# Patient Record
Sex: Male | Born: 1960 | Race: Black or African American | Hispanic: No | Marital: Single | Smoking: Never smoker
Health system: Southern US, Community
[De-identification: ages and names within clinical notes are randomized; demographics above are authoritative.]

## PROBLEM LIST (undated history)

## (undated) DIAGNOSIS — Z8042 Family history of malignant neoplasm of prostate: Secondary | ICD-10-CM

## (undated) DIAGNOSIS — Z800 Family history of malignant neoplasm of digestive organs: Secondary | ICD-10-CM

## (undated) DIAGNOSIS — K219 Gastro-esophageal reflux disease without esophagitis: Secondary | ICD-10-CM

## (undated) DIAGNOSIS — I10 Essential (primary) hypertension: Secondary | ICD-10-CM

## (undated) DIAGNOSIS — E119 Type 2 diabetes mellitus without complications: Secondary | ICD-10-CM

## (undated) DIAGNOSIS — C61 Malignant neoplasm of prostate: Secondary | ICD-10-CM

## (undated) DIAGNOSIS — Z803 Family history of malignant neoplasm of breast: Secondary | ICD-10-CM

## (undated) DIAGNOSIS — N401 Enlarged prostate with lower urinary tract symptoms: Secondary | ICD-10-CM

## (undated) HISTORY — DX: Type 2 diabetes mellitus without complications: E11.9

## (undated) HISTORY — DX: Family history of malignant neoplasm of digestive organs: Z80.0

## (undated) HISTORY — DX: Family history of malignant neoplasm of breast: Z80.3

## (undated) HISTORY — PX: DENTAL SURGERY: SHX609

## (undated) HISTORY — PX: PROSTATE BIOPSY: SHX241

## (undated) HISTORY — DX: Family history of malignant neoplasm of prostate: Z80.42

---

## 1997-07-01 ENCOUNTER — Other Ambulatory Visit: Admission: RE | Admit: 1997-07-01 | Discharge: 1997-07-01 | Payer: Self-pay | Admitting: Family Medicine

## 2007-12-18 ENCOUNTER — Emergency Department (HOSPITAL_COMMUNITY): Admission: EM | Admit: 2007-12-18 | Discharge: 2007-12-18 | Payer: Self-pay | Admitting: Emergency Medicine

## 2016-01-16 HISTORY — PX: COLONOSCOPY WITH PROPOFOL: SHX5780

## 2016-02-24 DIAGNOSIS — M7552 Bursitis of left shoulder: Secondary | ICD-10-CM | POA: Diagnosis not present

## 2016-02-24 DIAGNOSIS — Z5181 Encounter for therapeutic drug level monitoring: Secondary | ICD-10-CM | POA: Diagnosis not present

## 2016-02-24 DIAGNOSIS — I10 Essential (primary) hypertension: Secondary | ICD-10-CM | POA: Diagnosis not present

## 2016-02-24 DIAGNOSIS — Z131 Encounter for screening for diabetes mellitus: Secondary | ICD-10-CM | POA: Diagnosis not present

## 2016-02-29 DIAGNOSIS — M25512 Pain in left shoulder: Secondary | ICD-10-CM | POA: Diagnosis not present

## 2016-03-06 DIAGNOSIS — Z0000 Encounter for general adult medical examination without abnormal findings: Secondary | ICD-10-CM | POA: Diagnosis not present

## 2016-03-06 DIAGNOSIS — Z131 Encounter for screening for diabetes mellitus: Secondary | ICD-10-CM | POA: Diagnosis not present

## 2016-03-06 DIAGNOSIS — Z01118 Encounter for examination of ears and hearing with other abnormal findings: Secondary | ICD-10-CM | POA: Diagnosis not present

## 2016-03-06 DIAGNOSIS — Z136 Encounter for screening for cardiovascular disorders: Secondary | ICD-10-CM | POA: Diagnosis not present

## 2016-03-21 DIAGNOSIS — Z1211 Encounter for screening for malignant neoplasm of colon: Secondary | ICD-10-CM | POA: Diagnosis not present

## 2016-03-28 DIAGNOSIS — M25512 Pain in left shoulder: Secondary | ICD-10-CM | POA: Diagnosis not present

## 2016-03-29 DIAGNOSIS — M7552 Bursitis of left shoulder: Secondary | ICD-10-CM | POA: Diagnosis not present

## 2016-03-29 DIAGNOSIS — E119 Type 2 diabetes mellitus without complications: Secondary | ICD-10-CM | POA: Diagnosis not present

## 2016-03-29 DIAGNOSIS — R7989 Other specified abnormal findings of blood chemistry: Secondary | ICD-10-CM | POA: Diagnosis not present

## 2016-03-29 DIAGNOSIS — I10 Essential (primary) hypertension: Secondary | ICD-10-CM | POA: Diagnosis not present

## 2016-04-10 DIAGNOSIS — D128 Benign neoplasm of rectum: Secondary | ICD-10-CM | POA: Diagnosis not present

## 2016-04-10 DIAGNOSIS — D123 Benign neoplasm of transverse colon: Secondary | ICD-10-CM | POA: Diagnosis not present

## 2016-04-10 DIAGNOSIS — Z1211 Encounter for screening for malignant neoplasm of colon: Secondary | ICD-10-CM | POA: Diagnosis not present

## 2016-04-10 DIAGNOSIS — K621 Rectal polyp: Secondary | ICD-10-CM | POA: Diagnosis not present

## 2016-04-10 DIAGNOSIS — K635 Polyp of colon: Secondary | ICD-10-CM | POA: Diagnosis not present

## 2016-06-26 DIAGNOSIS — E119 Type 2 diabetes mellitus without complications: Secondary | ICD-10-CM | POA: Diagnosis not present

## 2016-06-26 DIAGNOSIS — R7989 Other specified abnormal findings of blood chemistry: Secondary | ICD-10-CM | POA: Diagnosis not present

## 2016-06-26 DIAGNOSIS — I10 Essential (primary) hypertension: Secondary | ICD-10-CM | POA: Diagnosis not present

## 2016-10-30 DIAGNOSIS — E119 Type 2 diabetes mellitus without complications: Secondary | ICD-10-CM | POA: Diagnosis not present

## 2016-10-30 DIAGNOSIS — E559 Vitamin D deficiency, unspecified: Secondary | ICD-10-CM | POA: Diagnosis not present

## 2016-10-30 DIAGNOSIS — I10 Essential (primary) hypertension: Secondary | ICD-10-CM | POA: Diagnosis not present

## 2016-12-11 DIAGNOSIS — R972 Elevated prostate specific antigen [PSA]: Secondary | ICD-10-CM | POA: Diagnosis not present

## 2017-01-17 DIAGNOSIS — R972 Elevated prostate specific antigen [PSA]: Secondary | ICD-10-CM | POA: Diagnosis not present

## 2017-01-17 DIAGNOSIS — N4232 Atypical small acinar proliferation of prostate: Secondary | ICD-10-CM | POA: Diagnosis not present

## 2017-01-17 DIAGNOSIS — C61 Malignant neoplasm of prostate: Secondary | ICD-10-CM | POA: Diagnosis not present

## 2017-01-22 DIAGNOSIS — I10 Essential (primary) hypertension: Secondary | ICD-10-CM | POA: Diagnosis not present

## 2017-01-22 DIAGNOSIS — E119 Type 2 diabetes mellitus without complications: Secondary | ICD-10-CM | POA: Diagnosis not present

## 2017-01-22 DIAGNOSIS — E559 Vitamin D deficiency, unspecified: Secondary | ICD-10-CM | POA: Diagnosis not present

## 2017-01-30 DIAGNOSIS — C61 Malignant neoplasm of prostate: Secondary | ICD-10-CM | POA: Diagnosis not present

## 2017-02-12 DIAGNOSIS — C61 Malignant neoplasm of prostate: Secondary | ICD-10-CM | POA: Diagnosis not present

## 2017-02-13 ENCOUNTER — Encounter: Payer: Self-pay | Admitting: Radiation Oncology

## 2017-02-22 ENCOUNTER — Encounter: Payer: Self-pay | Admitting: Radiation Oncology

## 2017-02-22 NOTE — Progress Notes (Signed)
GU Location of Tumor / Histology: prostatic adenocarcinoma  If Prostate Cancer, Gleason Score is (4 + 3) and PSA is (5.13). Prostate volume: 58 cc.  10/31/2016  PSA  4.9  Cl[place] Cliff Was referred by Dr. Doristine Section Bonsu to Dr. Karsten Ro November 2018 for further evaluation of an elevated PSA.  Biopsies of prostate (if applicable) revealed:    Past/Anticipated interventions by urology, if any: biopsy, referral to radiation oncology to discuss external beam radiation vs seed implant. Patient not interested in surgery.  Past/Anticipated interventions by medical oncology, if any: no  Weight changes, if any: no  Bowel/Bladder complaints, if any: IPSS 4. Denies dysuria, hematuria, urinary leakage or incontinence.   Nausea/Vomiting, if any: no  Pain issues, if any:  Occasional left shoulder and knee pain  SAFETY ISSUES:  Prior radiation? no  Pacemaker/ICD? no  Possible current pregnancy? no  Is the patient on methotrexate? no  Current Complaints / other details:  57 year old male. Single. No children. Strong family history of cancer: father deceased (gastric), mother alive (breast), paternal grandmother deceased (breast).

## 2017-02-25 ENCOUNTER — Ambulatory Visit
Admission: RE | Admit: 2017-02-25 | Discharge: 2017-02-25 | Disposition: A | Discharge status: Home / Self Care | Payer: Commercial Managed Care - PPO | Source: Ambulatory Visit | Attending: Radiation Oncology | Admitting: Radiation Oncology

## 2017-02-25 ENCOUNTER — Encounter: Payer: Self-pay | Admitting: Radiation Oncology

## 2017-02-25 ENCOUNTER — Other Ambulatory Visit: Payer: Self-pay

## 2017-02-25 ENCOUNTER — Encounter: Payer: Self-pay | Admitting: Medical Oncology

## 2017-02-25 VITALS — BP 141/90 | HR 66 | Temp 98.0°F | Resp 18 | Ht 75.0 in | Wt 218.6 lb

## 2017-02-25 DIAGNOSIS — R972 Elevated prostate specific antigen [PSA]: Secondary | ICD-10-CM | POA: Diagnosis not present

## 2017-02-25 DIAGNOSIS — Z79899 Other long term (current) drug therapy: Secondary | ICD-10-CM | POA: Insufficient documentation

## 2017-02-25 DIAGNOSIS — Z803 Family history of malignant neoplasm of breast: Secondary | ICD-10-CM | POA: Insufficient documentation

## 2017-02-25 DIAGNOSIS — Z809 Family history of malignant neoplasm, unspecified: Secondary | ICD-10-CM

## 2017-02-25 DIAGNOSIS — C61 Malignant neoplasm of prostate: Secondary | ICD-10-CM | POA: Insufficient documentation

## 2017-02-25 DIAGNOSIS — Z800 Family history of malignant neoplasm of digestive organs: Secondary | ICD-10-CM | POA: Diagnosis not present

## 2017-02-25 DIAGNOSIS — Z8042 Family history of malignant neoplasm of prostate: Secondary | ICD-10-CM | POA: Diagnosis not present

## 2017-02-25 HISTORY — DX: Malignant neoplasm of prostate: C61

## 2017-02-25 NOTE — Progress Notes (Signed)
See progress note under physician encounter. 

## 2017-02-25 NOTE — Progress Notes (Signed)
Introduced myself to Eric Leonard and his sister Eric Leonard as the prostate nurse navigator and my role. He states he is not interested in surgery but leaning towards brachytherapy. I gave him my business card and asked him to call me with questions and or concerns. He voiced understanding.

## 2017-02-25 NOTE — Progress Notes (Signed)
Radiation Oncology         [phone] ________________________________  Initial Outpatient Consultation  Name: Eric Leonard MRN: [id number]  Date: 02/25/2017  DOB: [birth year]  [id number], No Pcp Per  Kathie Rhodes, MD   REFERRING PHYSICIAN: Kathie Rhodes, MD  DIAGNOSIS: 57 y.o. gentleman with Stage T1c adenocarcinoma of the prostate with Gleason Score of 4+3, and PSA of 5.13    ICD-10-CM   1. Malignant neoplasm of prostate ([place]) [facility] Ambulatory Referral to Genetics  2. Family history of cancer Z80.9 Ambulatory Referral to Genetics    HISTORY OF PRESENT ILLNESS: Eric Leonard is a 57 y.o. male with a newly diagnosed prostate cancer, seen at the request of Dr. Karsten Ro. He was noted to have an elevated PSA of 4.9 in October 2018 by his primary care physician, Dr. Doristine Section Bonsu.  Accordingly, he was referred for evaluation in urology to Dr. Karsten Ro on 12/11/2016, where a digital rectal examination was performed at that time revealing no prostate nodules, and a repeat PSA remained elevated at 5.13. The patient proceeded to transrectal ultrasound with 12 biopsies of the prostate on 01/17/2017.  The prostate volume measured 58.26 cc.  Out of 12 core biopsies, 3 were positive.  The maximum Gleason score was 4+3, and this was seen in the left base lateral.  Biopsies of prostate revealed:    The patient reviewed the biopsy results with his urologist and he has kindly been referred today for discussion of potential radiation treatment options.   PREVIOUS RADIATION THERAPY: No  PAST MEDICAL HISTORY:  Past Medical History:  Diagnosis Date  . Prostate cancer ([place])       PAST SURGICAL HISTORY: Past Surgical History:  Procedure Laterality Date  . DENTAL SURGERY    . PROSTATE BIOPSY      FAMILY HISTORY:  Family History  Problem Relation Age of Onset  . Breast cancer Mother   . Stomach cancer Father   . Prostate cancer Paternal Uncle   . Breast cancer Paternal Grandmother   .  Pancreatic cancer Paternal Aunt     SOCIAL HISTORY:  Social History   Socioeconomic History  . Marital status: Single    Spouse name: Not on file  . Number of children: 0  . Years of education: Not on file  . Highest education level: Not on file  Social Needs  . Financial resource strain: Not on file  . Food insecurity - worry: Not on file  . Food insecurity - inability: Not on file  . Transportation needs - medical: Not on file  . Transportation needs - non-medical: Not on file  Occupational History    Comment: Glass blower/designer  Tobacco Use  . Smoking status: Never Smoker  . Smokeless tobacco: Never Used  Substance and Sexual Activity  . Alcohol use: Yes    Comment: drinks beer socially  . Drug use: No  . Sexual activity: Not on file  Other Topics Concern  . Not on file  Social History Narrative   Single. No children. Resides in [place].     ALLERGIES: Patient has no known allergies.  MEDICATIONS:  Current Outpatient Medications  Medication Sig Dispense Refill  . calcium-vitamin D (OSCAL WITH D) 500-200 MG-UNIT tablet Take 1 tablet by mouth.    . hydrochlorothiazide (MICROZIDE) 12.5 MG capsule Take 12.5 mg by mouth daily.    . Multiple Vitamin (MULTIVITAMIN) tablet Take 1 tablet by mouth daily.    . sildenafil (REVATIO) 20 MG tablet Take 20 mg by  mouth 3 (three) times daily.     No current facility-administered medications for this encounter.     REVIEW OF SYSTEMS:  On review of systems, the patient reports that he is doing well overall. He denies any chest pain, shortness of breath, cough, fevers, chills, night sweats, unintended weight changes. He denies any bowel disturbances, and denies abdominal pain, nausea or vomiting. HE denies any new musculoskeletal or joint aches or pains. His IPSS was 4, indicating mild urinary symptoms. He is able to complete sexual activity with most attempts. A complete review of systems is obtained and is otherwise negative.      PHYSICAL EXAM:  Wt Readings from Last 3 Encounters:  02/25/17 218 lb 9.6 oz (99.2 kg)   Temp Readings from Last 3 Encounters:  02/25/17 98 F (36.7 C) (Oral)   BP Readings from Last 3 Encounters:  02/25/17 (!) 141/90   Pulse Readings from Last 3 Encounters:  02/25/17 66   Pain Assessment Pain Score: 0-No pain/10  In general this is a well appearing African American male in no acute distress. He is alert and oriented x4 and appropriate throughout the examination. HEENT reveals that the patient is normocephalic, atraumatic. EOMs are intact. PERRLA. Skin is intact without any evidence of gross lesions. Cardiovascular exam reveals a regular rate and rhythm, no clicks rubs or murmurs are auscultated. Chest is clear to auscultation bilaterally. Lymphatic assessment is performed and does not reveal any adenopathy in the cervical, supraclavicular, axillary, or inguinal chains. Abdomen has active bowel sounds in all quadrants and is intact. The abdomen is soft, non tender, non distended. Lower extremities are negative for pretibial pitting edema, deep calf tenderness, cyanosis or clubbing.   KPS = 100  100 - Normal; no complaints; no evidence of disease. 90   - Able to carry on normal activity; minor signs or symptoms of disease. 80   - Normal activity with effort; some signs or symptoms of disease. 36   - Cares for self; unable to carry on normal activity or to do active work. 60   - Requires occasional assistance, but is able to care for most of his personal needs. 50   - Requires considerable assistance and frequent medical care. 1   - Disabled; requires special care and assistance. 16   - Severely disabled; hospital admission is indicated although death not imminent. 89   - Very sick; hospital admission necessary; active supportive treatment necessary. 10   - Moribund; fatal processes progressing rapidly. 0     - Dead  Karnofsky DA, Abelmann WH, Craver LS and Burchenal JH [phone]) The  use of the nitrogen mustards in the palliative treatment of carcinoma: with particular reference to bronchogenic carcinoma Cancer 1 634-56  LABORATORY DATA:  No results found for: WBC, HGB, HCT, MCV, PLT No results found for: NA, K, CL, CO2 No results found for: ALT, AST, GGT, ALKPHOS, BILITOT   RADIOGRAPHY: No results found.    IMPRESSION/PLAN: 1. 57 y.o. gentleman with Stage T1c adenocarcinoma of the prostate with Gleason Score of 4+3, and PSA of 5.13. We discussed the pathology findings and reviewed the nature of prostate cancer, highlighting treatment options based on T staging, Gleason scores, and anatomy. His Gleason's puts him in the unfavorable intermediate risk (UIR) group.  After reviewing his options, he is a candidate for surgical resection with prostatectomy, external beam radiotherapy, or brachytherapy with seed implant. We discussed each, comparing and contrasting the therapies. We discussed the risks, benefits, short, and  long term effects of radiotherapy, and the patient is interested in proceeding with radioactive seed implant with SpaceOAR gel. We will notify Dr. Karsten Ro and move forward with scheduling his procedure.  2. Possible genetic predisposition to malignancy. The patient's personal and family history raise suspicion for genetic links to cancer. He is offered and is interested in referral for genetic counseling. Orders were placed for referral.      Carola Rhine, PAC And  Sheral Apley. Tammi Klippel, M.D.  This document serves as a record of services personally performed by Tyler Pita, MD and Shona Simpson, PA-C. It was created on their behalf by Rae Lips, a trained medical scribe. The creation of this record is based on the scribe's personal observations and the providers' statements to them. This document has been checked and approved by the attending providers.

## 2017-02-28 ENCOUNTER — Telehealth: Payer: Self-pay | Admitting: Genetic Counselor

## 2017-02-28 NOTE — Telephone Encounter (Signed)
Scheduled appt per 2/12 sch message - left message with appt date and time and sent reminder letter in the mail

## 2017-03-01 ENCOUNTER — Telehealth: Payer: Self-pay | Admitting: *Deleted

## 2017-03-01 NOTE — Telephone Encounter (Signed)
CALLED PATIENT TO INFORM OF PRE-SEED APPT. ON 03-22-17 @ 2 PM, SPOKE WITH PATIENT AND HE IS AWARE OF THIS APPT.

## 2017-03-21 ENCOUNTER — Telehealth: Payer: Self-pay | Admitting: *Deleted

## 2017-03-21 NOTE — Telephone Encounter (Signed)
CALLED PATIENT TO REMIND OF PRE-SEED APPT. FOR 03-22-17, NO ANSWER.

## 2017-03-21 NOTE — Telephone Encounter (Signed)
CALLED PATIENT TO REMIND OF PRE-SEED APPT. FOR 03-22-17, SPOKE WITH PATIENT AND HE IS AWARE OF THIS APPT.

## 2017-03-22 ENCOUNTER — Encounter: Payer: Self-pay | Admitting: Medical Oncology

## 2017-03-22 ENCOUNTER — Ambulatory Visit
Admission: RE | Admit: 2017-03-22 | Discharge: 2017-03-22 | Disposition: A | Discharge status: Home / Self Care | Payer: Commercial Managed Care - PPO | Source: Ambulatory Visit | Attending: Radiation Oncology | Admitting: Radiation Oncology

## 2017-03-22 DIAGNOSIS — C61 Malignant neoplasm of prostate: Secondary | ICD-10-CM | POA: Diagnosis not present

## 2017-03-22 NOTE — Progress Notes (Signed)
  Radiation Oncology         [phone]) [phone] ________________________________  Name: Ociel Retherford MRN: [id number]  Date: 03/22/2017  DOB: 07/01/1960  SIMULATION AND TREATMENT PLANNING NOTE PUBIC ARCH STUDY  [id number], No Pcp Per  Kathie Rhodes, MD  DIAGNOSIS: 57 y.o. gentleman with stage T1c adenocarcinoma of the prostate with Gleason score of 4+3, and PSA of 5.13.    ICD-10-CM   1. Malignant neoplasm of prostate ([place]) C61     COMPLEX SIMULATION:  The patient presented today for evaluation for possible prostate seed implant. He was brought to the radiation planning suite and placed supine on the CT couch. A 3-dimensional image study set was obtained in upload to the planning computer. There, on each axial slice, I contoured the prostate gland. Then, using three-dimensional radiation planning tools I reconstructed the prostate in view of the structures from the transperineal needle pathway to assess for possible pubic arch interference. In doing so, I did not appreciate any pubic arch interference. Also, the patient's prostate volume was estimated based on the drawn structure. The volume was 48 cc.  Given the pubic arch appearance and prostate volume, patient remains a good candidate to proceed with prostate seed implant. Today, he freely provided informed written consent to proceed.    PLAN: The patient will undergo prostate seed implant.   ________________________________  Sheral Apley. Tammi Klippel, M.D.     This document serves as a record of services personally performed by Tyler Pita MD. It was created on his behalf by Delton Coombes, a trained medical scribe. The creation of this record is based on the scribe's personal observations and the provider's statements to them.

## 2017-03-26 ENCOUNTER — Telehealth: Payer: Self-pay | Admitting: *Deleted

## 2017-03-26 ENCOUNTER — Other Ambulatory Visit: Payer: Self-pay | Admitting: Urology

## 2017-03-26 NOTE — Telephone Encounter (Signed)
Called patient to inform of implant date, lvm for a return call 

## 2017-03-29 ENCOUNTER — Other Ambulatory Visit: Payer: Self-pay | Admitting: Urology

## 2017-03-29 DIAGNOSIS — C61 Malignant neoplasm of prostate: Secondary | ICD-10-CM

## 2017-04-01 ENCOUNTER — Ambulatory Visit (HOSPITAL_COMMUNITY)
Admission: RE | Admit: 2017-04-01 | Discharge: 2017-04-01 | Disposition: A | Discharge status: Home / Self Care | Payer: Commercial Managed Care - PPO | Source: Ambulatory Visit | Attending: Urology | Admitting: Urology

## 2017-04-01 ENCOUNTER — Encounter (INDEPENDENT_AMBULATORY_CARE_PROVIDER_SITE_OTHER): Payer: Self-pay

## 2017-04-01 ENCOUNTER — Encounter (HOSPITAL_COMMUNITY)
Admission: RE | Admit: 2017-04-01 | Discharge: 2017-04-01 | Disposition: A | Discharge status: Home / Self Care | Payer: Commercial Managed Care - PPO | Source: Ambulatory Visit | Attending: Urology | Admitting: Urology

## 2017-04-01 DIAGNOSIS — Z01810 Encounter for preprocedural cardiovascular examination: Secondary | ICD-10-CM | POA: Diagnosis present

## 2017-04-01 DIAGNOSIS — Z01818 Encounter for other preprocedural examination: Secondary | ICD-10-CM

## 2017-04-15 ENCOUNTER — Encounter: Payer: Self-pay | Admitting: Genetic Counselor

## 2017-04-15 ENCOUNTER — Inpatient Hospital Stay: Payer: Commercial Managed Care - PPO

## 2017-04-15 ENCOUNTER — Inpatient Hospital Stay: Payer: Commercial Managed Care - PPO | Attending: Genetic Counselor | Admitting: Genetic Counselor

## 2017-04-15 DIAGNOSIS — Z8042 Family history of malignant neoplasm of prostate: Secondary | ICD-10-CM | POA: Diagnosis not present

## 2017-04-15 DIAGNOSIS — C61 Malignant neoplasm of prostate: Secondary | ICD-10-CM | POA: Diagnosis not present

## 2017-04-15 DIAGNOSIS — Z803 Family history of malignant neoplasm of breast: Secondary | ICD-10-CM

## 2017-04-15 DIAGNOSIS — Z800 Family history of malignant neoplasm of digestive organs: Secondary | ICD-10-CM

## 2017-04-15 NOTE — Progress Notes (Signed)
REFERRING PROVIDER: Tyler Pita, MD [place], [place] [id number]  PRIMARY PROVIDER:  System, Pcp Not In  PRIMARY REASON FOR VISIT:  1. Malignant neoplasm of prostate ([place])   2. Family history of breast cancer   3. Family history of stomach cancer   4. Family history of pancreatic cancer   5. Family history of prostate cancer      HISTORY OF PRESENT ILLNESS:   Eric Leonard, a 57 y.o. male, was seen for a [facility] cancer genetics consultation at the request of Dr. Tammi Klippel due to a personal and family history of cancer.  Eric Leonard presents to clinic today to discuss the possibility of a hereditary predisposition to cancer, genetic testing, and to further clarify his future cancer risks, as well as potential cancer risks for family members.   In 2019, at the age of 72, Eric Leonard was diagnosed with prostate cancer. His Gleason score is 4+3= 7. This will be treated with radioactive seeds.  He reports never having cancer in the past.      CANCER HISTORY:   No history exists.       Past Medical History:  Diagnosis Date  . Family history of breast cancer   . Family history of pancreatic cancer   . Family history of prostate cancer   . Family history of stomach cancer   . Prostate cancer [facility])     Past Surgical History:  Procedure Laterality Date  . DENTAL SURGERY    . PROSTATE BIOPSY      Social History   Socioeconomic History  . Marital status: Single    Spouse name: Not on file  . Number of children: 0  . Years of education: Not on file  . Highest education level: Not on file  Occupational History    Comment: Glass blower/designer  Social Needs  . Financial resource strain: Not on file  . Food insecurity:    Worry: Not on file    Inability: Not on file  . Transportation needs:    Medical: Not on file    Non-medical: Not on file  Tobacco Use  . Smoking status: Never Smoker  . Smokeless tobacco: Never Used  Substance and Sexual Activity  .  Alcohol use: Yes    Comment: drinks beer socially  . Drug use: No  . Sexual activity: Not on file  Lifestyle  . Physical activity:    Days per week: Not on file    Minutes per session: Not on file  . Stress: Not on file  Relationships  . Social connections:    Talks on phone: Not on file    Gets together: Not on file    Attends religious service: Not on file    Active member of club or organization: Not on file    Attends meetings of clubs or organizations: Not on file    Relationship status: Not on file  Other Topics Concern  . Not on file  Social History Narrative   Single. No children. Resides in [place].      FAMILY HISTORY:  We obtained a detailed, 4-generation family history.  Significant diagnoses are listed below: Family History  Problem Relation Age of Onset  . Breast cancer Mother        dx in her 53s  . Stomach cancer Father        d. 38  . Prostate cancer Paternal Uncle   . Breast cancer Paternal Grandmother   . Pancreatic cancer  Paternal Aunt   . Lung cancer Maternal Aunt     The patient does not have children.  He has one brother and two sisters who are all cancer free.  His mother is alive, and had breast cancer in her early 29's.  His father is deceased and had stomach cancer.  The patient's mother had breast cancer.  She has two sisters one who had lung cancer.  The patient's maternal grandparents are both deceased.  The patient's father had stomach cancer.  He had 9 siblings.  One brother had prostate cancer and a sister had pancreatic cancer.  The paternal grandmother had breast cancer.    Eric Leonard is unaware of previous family history of genetic testing for hereditary cancer risks. Patient's maternal ancestors are of African American descent, and paternal ancestors are of African American descent. There is no reported Ashkenazi Jewish ancestry. There is no known consanguinity.  GENETIC COUNSELING ASSESSMENT: Eric Leonard is a 57 y.o. male with a  personal and family history of cancer which is somewhat suggestive of a hereditary cancer syndrome and predisposition to cancer. We, therefore, discussed and recommended the following at today's visit.   DISCUSSION: We discussed that about 5-10% of prostate cancer is hereditary with most cases due to BRCA mutations.  The GI cancers in his family is also suggestive of Lynch syndrome.  We reviewed the characteristics, features and inheritance patterns of hereditary cancer syndromes. We also discussed genetic testing, including the appropriate family members to test, the process of testing, insurance coverage and turn-around-time for results. We discussed the implications of a negative, positive and/or variant of uncertain significant result. We recommended Eric Leonard pursue genetic testing for the common hereditary cancer gene panel. The Hereditary Gene Panel offered by [facility] includes sequencing and/or deletion duplication testing of the following 47 genes: APC, ATM, AXIN2, BARD1, BMPR1A, BRCA1, BRCA2, BRIP1, CDH1, CDK4, CDKN2A (p14ARF), CDKN2A (p16INK4a), CHEK2, CTNNA1, DICER1, EPCAM (Deletion/duplication testing only), GREM1 (promoter region deletion/duplication testing only), KIT, MEN1, MLH1, MSH2, MSH3, MSH6, MUTYH, NBN, NF1, NHTL1, PALB2, PDGFRA, PMS2, POLD1, POLE, PTEN, RAD50, RAD51C, RAD51D, SDHB, SDHC, SDHD, SMAD4, SMARCA4. STK11, TP53, TSC1, TSC2, and VHL.  The following genes were evaluated for sequence changes only: SDHA and HOXB13 c.251G>A variant only.   Based on Eric Leonard personal and family history of cancer, he meets medical criteria for genetic testing. Despite that he meets criteria, he may still have an out of pocket cost. We discussed that if his out of pocket cost for testing is over $100, the laboratory will call and confirm whether he wants to proceed with testing.  If the out of pocket cost of testing is less than $100 he will be billed by the genetic testing laboratory.   PLAN: After  considering the risks, benefits, and limitations, Eric Leonard  provided informed consent to pursue genetic testing and the blood sample was sent to [facility] for analysis of the common hereditary cancer panel. Results should be available within approximately 2-3 weeks' time, at which point they will be disclosed by telephone to Eric Leonard, as will any additional recommendations warranted by these results. Eric Leonard will receive a summary of his genetic counseling visit and a copy of his results once available. This information will also be available in Epic. We encouraged Eric Leonard to remain in contact with cancer genetics annually so that we can continuously update the family history and inform him of any changes in cancer genetics and testing that may be of benefit  for his family. Eric Leonard questions were answered to his satisfaction today. Our contact information was provided should additional questions or concerns arise.  Lastly, we encouraged Eric Leonard to remain in contact with cancer genetics annually so that we can continuously update the family history and inform him of any changes in cancer genetics and testing that may be of benefit for this family.   Mr.  Leonard questions were answered to his satisfaction today. Our contact information was provided should additional questions or concerns arise. Thank you for the referral and allowing [place] to share in the care of your patient.   Karen P. Florene Glen, [place], [facility] Certified Genetic Counselor [email or web address] phone: [phone]  The patient was seen for a total of 30 minutes in face-to-face genetic counseling.  This patient was discussed with Drs. Magrinat, Lindi Adie and/or Burr Medico who agrees with the above.    _______________________________________________________________________ For Office Staff:  Number of people involved in session: 1 Was an Intern/ student involved with case: no

## 2017-04-23 DIAGNOSIS — Z136 Encounter for screening for cardiovascular disorders: Secondary | ICD-10-CM | POA: Diagnosis not present

## 2017-04-23 DIAGNOSIS — E559 Vitamin D deficiency, unspecified: Secondary | ICD-10-CM | POA: Diagnosis not present

## 2017-04-23 DIAGNOSIS — I10 Essential (primary) hypertension: Secondary | ICD-10-CM | POA: Diagnosis not present

## 2017-04-23 DIAGNOSIS — Z01118 Encounter for examination of ears and hearing with other abnormal findings: Secondary | ICD-10-CM | POA: Diagnosis not present

## 2017-04-23 DIAGNOSIS — Z0000 Encounter for general adult medical examination without abnormal findings: Secondary | ICD-10-CM | POA: Diagnosis not present

## 2017-04-23 DIAGNOSIS — E119 Type 2 diabetes mellitus without complications: Secondary | ICD-10-CM | POA: Diagnosis not present

## 2017-04-23 DIAGNOSIS — H538 Other visual disturbances: Secondary | ICD-10-CM | POA: Diagnosis not present

## 2017-04-30 NOTE — H&P (Signed)
HPI: Eric Leonard is a 57 year-old male with prostate cancer.  His prostate cancer was diagnosed 01/17/2017. His PSA at his time of diagnosis was 5.13. His cancer was T1c, Gleason 4+3 = 7 in 1 core and 3+3 = 6 in 2 cores.   Adenocarcinoma of the prostate: He was found to have an elevated PSA in 10/18 of 4.9/11.6%. I repeated his PSA on 12/11/16 and it was 5.13/12%. No abnormality was noted on DRE.  TRUS/BX 01/17/17: Prostate volume - 58 cc  Pathology: Adenocarcinoma Gleason 4+3 = 7 in 1 core and 3+3 = 6 in 2 cores.  Stage: T1c   01/30/17: He reports he had no significant difficulties following his prostate biopsy.   02/12/17: He has returned today after having reviewed the information I had supplied him regarding definitive treatment for his newly diagnosed intermediate risk prostate cancer.     ALLERGIES: No Allergies    MEDICATIONS: Hydrochlorothiazide  Sildenafil 20 mg tablet 1-5 tablet PO PRN  Multivitamin  Vitamin D3     GU PSH: Prostate Needle Biopsy - 01/17/2017    NON-GU PSH: Dental Surgery Procedure Surgical Pathology, Gross And Microscopic Examination For Prostate Needle - 01/17/2017    GU PMH: ED due to arterial insufficiency, He has elected to proceed with treatment with sildenafil. That was dispensed today. - 01/30/2017 Prostate Cancer, I have given him a copy of his pathology report, information on treatment options and he is going to review this and returned to discuss how he would like to proceed with treatment. - 01/30/2017 Elevated PSA (Stable), I have discussed with the patient the possibility of blood per rectum, per urethra and in the ejaculate. He was counseled to contact me if he has any difficulties following his prostate biopsy whatsoever. - 01/17/2017, At this point I have recommended we repeat the PSA since we do not have any other PSA values for reference. If his PSA remains elevated he told me that he would want to proceed with further evaluation with TRUS/Bx., -  12/11/2016    NON-GU PMH: GERD Hypertension    FAMILY HISTORY: Breast Cancer - Mother Carcinoma Of The Stomach - Father Prostate Cancer - Uncle   SOCIAL HISTORY: Marital Status: Single Preferred Language: English; Race: Black or African American Current Smoking Status: Patient has never smoked.   Tobacco Use Assessment Completed: Used Tobacco in last 30 days? Types of alcohol consumed: Beer. Social Drinker.  Does not drink caffeine. Patient's occupation Engineer, maintenance.     Notes: No children   REVIEW OF SYSTEMS:    GU Review Male:   Patient denies frequent urination, hard to postpone urination, burning/ pain with urination, get up at night to urinate, leakage of urine, stream starts and stops, trouble starting your stream, have to strain to urinate , erection problems, and penile pain.  Gastrointestinal (Upper):   Patient denies nausea, vomiting, and indigestion/ heartburn.  Gastrointestinal (Lower):   Patient denies diarrhea and constipation.  Constitutional:   Patient denies fever, night sweats, weight loss, and fatigue.  Skin:   Patient denies skin rash/ lesion and itching.  Eyes:   Patient denies blurred vision and double vision.  Ears/ Nose/ Throat:   Patient denies sore throat and sinus problems.  Hematologic/Lymphatic:   Patient denies swollen glands and easy bruising.  Cardiovascular:   Patient denies leg swelling and chest pains.  Respiratory:   Patient denies cough and shortness of breath.  Endocrine:   Patient denies excessive thirst.  Musculoskeletal:  Patient denies back pain and joint pain.  Neurological:   Patient denies dizziness and headaches.  Psychologic:   Patient denies depression and anxiety.   VITAL SIGNS:    Weight 210 lb / 95.25 kg  Height 75 in / 190.5 cm  BP 133/81 mmHg  Pulse 58 /min  Temperature 97.8 F / 36.5 C  BMI 26.2 kg/m   GU PHYSICAL EXAMINATION:    Anus and Perineum: No hemorrhoids. No anal stenosis. No rectal fissure, no  anal fissure. No edema, no dimple, no perineal tenderness, no anal tenderness.  Scrotum: No lesions. No edema. No cysts. No warts.  Epididymides: Right: no spermatocele, no masses, no cysts, no tenderness, no induration, no enlargement. Left: no spermatocele, no masses, no cysts, no tenderness, no induration, no enlargement.  Testes: No tenderness, no swelling, no enlargement left testes. No tenderness, no swelling, no enlargement right testes. Normal location left testes. Normal location right testes. No mass, no cyst, no varicocele, no hydrocele left testes. No mass, no cyst, no varicocele, no hydrocele right testes.  Urethral Meatus: Normal size. No lesion, no wart, no discharge, no polyp. Normal location.  Penis: Penis uncircumcised. No foreskin warts, no cracks. No dorsal peyronie's plaques, no left corporal peyronie's plaques, no right corporal peyronie's plaques, no scarring, no shaft warts. No balanitis, no meatal stenosis.   Prostate: 40 gram or 2+ size. Left lobe normal consistency, right lobe normal consistency. Symmetrical lobes. No prostate nodule. Left lobe no tenderness, right lobe no tenderness.  Seminal Vesicles: Nonpalpable.  Sphincter Tone: Normal sphincter. No rectal tenderness. No rectal mass.    MULTI-SYSTEM PHYSICAL EXAMINATION:    Constitutional: Well-nourished. No physical deformities. Normally developed. Good grooming.  Neck: Neck symmetrical, not swollen. Normal tracheal position.  Respiratory: No labored breathing, no use of accessory muscles.   Cardiovascular: Normal temperature, normal extremity pulses, no swelling, no varicosities.  Lymphatic: No enlargement of neck, axillae, groin.  Skin: No paleness, no jaundice, no cyanosis. No lesion, no ulcer, no rash.  Neurologic / Psychiatric: Oriented to time, oriented to place, oriented to person. No depression, no anxiety, no agitation.  Gastrointestinal: No mass, no tenderness, no rigidity, non obese abdomen.  Eyes: Normal  conjunctivae. Normal eyelids.  Ears, Nose, Mouth, and Throat: Left ear no scars, no lesions, no masses. Right ear no scars, no lesions, no masses. Nose no scars, no lesions, no masses. Normal hearing. Normal lips.  Musculoskeletal: Normal gait and station of head and neck.     PAST DATA REVIEWED:  Source Of History:  Patient   12/11/16 10/31/16  PSA  Total PSA 5.13 ng/mL 4.9 ng/dl  Free PSA 0.59 ng/mL   % Free PSA 12 % PSA 11.6 %   Notes:                     Partin table results: His progression free probability with radical prostatectomy would be approximately 75% and 60% at 5 and 10 years respectively. His probability of organ confined disease is approximately 50% with about a 50% chance of extracapsular extension, a 5% chance of lymph node involvement and a similar probability of seminal vesicle involvement.   PROCEDURES:          Urinalysis Dipstick Dipstick Cont'd  Color: Yellow Bilirubin: Neg  Appearance: Clear Ketones: Neg  Specific Gravity: 1.020 Blood: Neg  pH: 7.0 Protein: Neg  Glucose: Neg Urobilinogen: 0.2    Nitrites: Neg    Leukocyte Esterase: Neg    ASSESSMENT/PLAN:  ICD-10 Details  1 GU:   Prostate Cancer -  He came in with his sister and we had a long discussion about his options. She had done a great deal of research and had numerous very well thought out questions which I have answered to her satisfaction. I 1st have determined that he did not want to proceed with any form of surgical therapy.  We also discussed his pathology and went over his pathology report and its meaning in detail. He currently has a single core of Gleason 4+ 3 with 2 cores of 3+ 3 and although this places him at an intermediate risk/unfavorable I told him that he had a very low volume of the Gleason 7 and therefore he likely had a low risk cancer but due to his very young age my recommendation was absolute that we proceed with some treatment rather than observation. We therefore discussed  the rationale not to obtain further testing such as repeat biopsy or genomic testing at this time as the recommendation has been to proceed with treatment and therefore we discussed external beam verses radioactive seed implantation. We discussed the risks and complications and he would like to consider radioactive seed implantation at this time.

## 2017-05-01 DIAGNOSIS — C61 Malignant neoplasm of prostate: Secondary | ICD-10-CM | POA: Diagnosis not present

## 2017-05-02 ENCOUNTER — Telehealth: Payer: Self-pay | Admitting: *Deleted

## 2017-05-02 ENCOUNTER — Other Ambulatory Visit: Payer: Self-pay

## 2017-05-02 ENCOUNTER — Encounter (HOSPITAL_BASED_OUTPATIENT_CLINIC_OR_DEPARTMENT_OTHER): Payer: Self-pay | Admitting: *Deleted

## 2017-05-02 NOTE — Telephone Encounter (Signed)
CALLED PATIENT TO REMIND OF LABS FOR Monday 05-06-17 - ARRIVAL TIME - 1:45 PM @ [facility] ADMITTING, LVM FOR A RETURN CALL

## 2017-05-02 NOTE — Progress Notes (Signed)
SPOKE W/ PT VIA PHONE FOR PRE-OP INTERVIEW.  NPO AFTER MN.  ARRIVE AT 0730.  GETTING LAB WORK DONE Monday 05-06-2017 @ 1400 (CBC,CMET,PT/INR,PTT).  CURRENT CXR AND EKG IN CHART AND Epic.  WILL DO FLEET ENEMA AM DOS.

## 2017-05-03 ENCOUNTER — Ambulatory Visit: Payer: Self-pay | Admitting: Genetic Counselor

## 2017-05-03 ENCOUNTER — Encounter: Payer: Self-pay | Admitting: Genetic Counselor

## 2017-05-03 ENCOUNTER — Telehealth: Payer: Self-pay | Admitting: Genetic Counselor

## 2017-05-03 DIAGNOSIS — C61 Malignant neoplasm of prostate: Secondary | ICD-10-CM

## 2017-05-03 DIAGNOSIS — Z1379 Encounter for other screening for genetic and chromosomal anomalies: Secondary | ICD-10-CM

## 2017-05-03 DIAGNOSIS — Z8042 Family history of malignant neoplasm of prostate: Secondary | ICD-10-CM

## 2017-05-03 DIAGNOSIS — Z803 Family history of malignant neoplasm of breast: Secondary | ICD-10-CM

## 2017-05-03 DIAGNOSIS — Z800 Family history of malignant neoplasm of digestive organs: Secondary | ICD-10-CM

## 2017-05-03 NOTE — Telephone Encounter (Signed)
Revealed negative genetic testing.  Discussed that we do not know why he has prostate cancer or why there is cancer in the family. It could be due to a different gene that we are not testing, or maybe our current technology may not be able to pick something up.  It will be important for him to keep in contact with genetics to keep up with whether additional testing may be needed.  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  We discussed that this is still a normal result.  This VUS does qualify for complementary family testing.  Patient states that he is not interested at this time but will let me know if he changes his mind.

## 2017-05-03 NOTE — Progress Notes (Signed)
HPI:  Eric Leonard was previously seen in the [place] [facility] due to a personal and family history of cancer and concerns regarding a hereditary predisposition to cancer. Please refer to our prior cancer genetics clinic note for more information regarding Eric Leonard's medical, social and family histories, and our assessment and recommendations, at the time. Eric Leonard recent genetic test results were disclosed to him, as were recommendations warranted by these results. These results and recommendations are discussed in more detail below.  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  The Hereditary Gene Panel offered by [facility] includes sequencing and/or deletion duplication testing of the following 47 genes: APC, ATM, AXIN2, BARD1, BMPR1A, BRCA1, BRCA2, BRIP1, CDH1, CDK4, CDKN2A (p14ARF), CDKN2A (p16INK4a), CHEK2, CTNNA1, DICER1, EPCAM (Deletion/duplication testing only), GREM1 (promoter region deletion/duplication testing only), KIT, MEN1, MLH1, MSH2, MSH3, MSH6, MUTYH, NBN, NF1, NHTL1, PALB2, PDGFRA, PMS2, POLD1, POLE, PTEN, RAD50, RAD51C, RAD51D, SDHB, SDHC, SDHD, SMAD4, SMARCA4. STK11, TP53, TSC1, TSC2, and VHL.  The following genes were evaluated for sequence changes only: SDHA and HOXB13 c.251G>A variant only. The report date is May 02, 2017.        FAMILY HISTORY:  We obtained a detailed, 4-generation family history.  Significant diagnoses are listed below: Family History  Problem Relation Age of Onset  . Breast cancer Mother        dx in her 3s  . Stomach cancer Father        d. 35  . Prostate cancer Paternal Uncle   . Breast cancer Paternal Grandmother   . Pancreatic cancer Paternal Aunt   . Lung cancer Maternal Aunt     The patient does not have children.  He  has one brother and two sisters who are all cancer free.  His mother is alive, and had breast cancer in her early 88's.  His father is deceased and had stomach cancer.  The patient's mother had breast cancer.  She has two sisters one who had lung cancer.  The patient's maternal grandparents are both deceased.  The patient's father had stomach cancer.  He had 9 siblings.  One brother had prostate cancer and a sister had pancreatic cancer.  The paternal grandmother had breast cancer.    Eric Leonard is unaware of previous family history of genetic testing for hereditary cancer risks. Patient's maternal ancestors are of African American descent, and paternal ancestors are of African American descent. There is no reported Ashkenazi Jewish ancestry. There is no known consanguinity.  GENETIC TEST RESULTS: Genetic testing reported out on May 02, 2017 through the common hereditary cancer panel found no deleterious mutations.  The Hereditary Gene Panel offered by [facility] includes sequencing and/or deletion duplication testing of the following 47 genes: APC, ATM, AXIN2, BARD1, BMPR1A, BRCA1, BRCA2, BRIP1, CDH1, CDK4, CDKN2A (p14ARF), CDKN2A (p16INK4a), CHEK2, CTNNA1, DICER1, EPCAM (Deletion/duplication testing only), GREM1 (promoter region deletion/duplication testing only), KIT, MEN1, MLH1, MSH2, MSH3, MSH6, MUTYH, NBN, NF1, NHTL1, PALB2, PDGFRA, PMS2, POLD1, POLE, PTEN, RAD50, RAD51C, RAD51D, SDHB, SDHC, SDHD, SMAD4, SMARCA4. STK11, TP53, TSC1, TSC2, and VHL.  The following genes were evaluated for sequence changes only: SDHA and HOXB13 c.251G>A variant only. The test report has been scanned into EPIC and is located under the Molecular Pathology section of the Results  Review tab.    We discussed with [name] that since the current genetic testing is not perfect, it is possible there may be a gene mutation in one of these genes that current testing cannot detect, but that chance is small.  We also discussed,  that it is possible that another gene that has not yet been discovered, or that we have not yet tested, is responsible for the cancer diagnoses in the family, and it is, therefore, important to remain in touch with cancer genetics in the future so that we can continue to offer [name] the most up to date genetic testing.   Genetic testing did detect a Variant of Unknown Significance in the [facility] gene called c/1774G>A (p.Ala592Thr). At this time, it is unknown if this variant is associated with increased cancer risk or if this is a normal finding, but most variants such as this get reclassified to being inconsequential. It should not be used to make medical management decisions. With time, we suspect the lab will determine the significance of this variant, if any. If we do learn more about it, we will try to contact Mr. Narvaez to discuss it further. However, it is important to stay in touch with [place] periodically and keep the address and phone number up to date.   Familial VUS testing is available for this finding.  This variant qualifies for complimentary family studies as part of [facility]'s VUS Resolution Program.  Testing informative family members for this variant may contribute evidence for future variant reclassification.  The patient was offered familial variant testing but has declined at this time.   CANCER SCREENING RECOMMENDATIONS: This result is reassuring and indicates that [name] likely does not have an increased risk for a future cancer due to a mutation in one of these genes. This normal test also suggests that [name]'s cancer was most likely not due to an inherited predisposition associated with one of these genes.  Most cancers happen by chance and this negative test suggests that his cancer falls into this category.  We, therefore, recommended he continue to follow the cancer management and screening guidelines provided by his oncology and primary healthcare provider.   An  individual's cancer risk and medical management are not determined by genetic test results alone. Overall cancer risk assessment incorporates additional factors, including personal medical history, family history, and any available genetic information that may result in a personalized plan for cancer prevention and surveillance.  RECOMMENDATIONS FOR FAMILY MEMBERS:  Women in this family might be at some increased risk of developing cancer, over the general population risk, simply due to the family history of cancer.  We recommended women in this family have a yearly mammogram beginning at age 57, or 81 years younger than the earliest onset of cancer, an annual clinical breast exam, and perform monthly breast self-exams. Women in this family should also have a gynecological exam as recommended by their primary provider. All family members should have a colonoscopy by age 58.  FOLLOW-UP: Lastly, we discussed with Mr. Monteverde that cancer genetics is a rapidly advancing field and it is possible that new genetic tests will be appropriate for him and/or his family members in the future. We encouraged him to remain in contact with cancer genetics on an annual basis so we can update his personal and family histories and let him know of advances in cancer genetics that may benefit this family.   Our contact number was provided. Mr. Kann questions were  answered to his satisfaction, and he knows he is welcome to call us at anytime with additional questions or concerns.   Roma Kayser, MS, [facility] Certified Genetic Counselor [email or web address]

## 2017-05-06 ENCOUNTER — Encounter (HOSPITAL_COMMUNITY)
Admission: RE | Admit: 2017-05-06 | Discharge: 2017-05-06 | Disposition: A | Discharge status: Home / Self Care | Payer: Commercial Managed Care - PPO | Source: Ambulatory Visit | Attending: Urology | Admitting: Urology

## 2017-05-06 DIAGNOSIS — Z01812 Encounter for preprocedural laboratory examination: Secondary | ICD-10-CM | POA: Diagnosis not present

## 2017-05-06 LAB — CBC
HCT: 44.4 % (ref 39.0–52.0)
Hemoglobin: 15.4 g/dL (ref 13.0–17.0)
MCH: 32 pg (ref 26.0–34.0)
MCHC: 34.7 g/dL (ref 30.0–36.0)
MCV: 92.1 fL (ref 78.0–100.0)
Platelets: 200 10*3/uL (ref 150–400)
RBC: 4.82 MIL/uL (ref 4.22–5.81)
RDW: 14 % (ref 11.5–15.5)
WBC: 5.5 10*3/uL (ref 4.0–10.5)

## 2017-05-06 LAB — COMPREHENSIVE METABOLIC PANEL
ALT: 35 U/L (ref 17–63)
AST: 30 U/L (ref 15–41)
Albumin: 4 g/dL (ref 3.5–5.0)
Alkaline Phosphatase: 31 U/L — ABNORMAL LOW (ref 38–126)
Anion gap: 10 (ref 5–15)
BUN: 25 mg/dL — ABNORMAL HIGH (ref 6–20)
CO2: 25 mmol/L (ref 22–32)
Calcium: 9 mg/dL (ref 8.9–10.3)
Chloride: 105 mmol/L (ref 101–111)
Creatinine, Ser: 1.22 mg/dL (ref 0.61–1.24)
GFR calc Af Amer: >60 mL/min (ref >60)
GFR calc non Af Amer: >60 mL/min (ref >60)
Glucose, Bld: 103 mg/dL — ABNORMAL HIGH (ref 65–99)
Potassium: 4.1 mmol/L (ref 3.5–5.1)
Sodium: 140 mmol/L (ref 135–145)
Total Bilirubin: 1.2 mg/dL (ref 0.3–1.2)
Total Protein: 7 g/dL (ref 6.5–8.1)

## 2017-05-06 LAB — PROTIME-INR
INR: 1.07
Prothrombin Time: 13.8 seconds (ref 11.4–15.2)

## 2017-05-06 LAB — APTT: aPTT: 28 seconds (ref 24–36)

## 2017-05-10 ENCOUNTER — Telehealth: Payer: Self-pay | Admitting: *Deleted

## 2017-05-10 NOTE — Telephone Encounter (Signed)
Called patient to remind of procedure for 05-13-17, spoke with patient and he is aware of this procedure

## 2017-05-13 ENCOUNTER — Ambulatory Visit (HOSPITAL_COMMUNITY): Payer: Commercial Managed Care - PPO

## 2017-05-13 ENCOUNTER — Encounter (HOSPITAL_BASED_OUTPATIENT_CLINIC_OR_DEPARTMENT_OTHER): Payer: Self-pay | Admitting: *Deleted

## 2017-05-13 ENCOUNTER — Other Ambulatory Visit: Payer: Self-pay

## 2017-05-13 ENCOUNTER — Encounter (HOSPITAL_BASED_OUTPATIENT_CLINIC_OR_DEPARTMENT_OTHER)
Admission: RE | Disposition: A | Discharge status: Home / Self Care | Payer: Self-pay | Source: Ambulatory Visit | Attending: Urology

## 2017-05-13 ENCOUNTER — Ambulatory Visit (HOSPITAL_BASED_OUTPATIENT_CLINIC_OR_DEPARTMENT_OTHER): Payer: Commercial Managed Care - PPO | Admitting: Anesthesiology

## 2017-05-13 ENCOUNTER — Ambulatory Visit (HOSPITAL_BASED_OUTPATIENT_CLINIC_OR_DEPARTMENT_OTHER)
Admission: RE | Admit: 2017-05-13 | Discharge: 2017-05-13 | Disposition: A | Discharge status: Home / Self Care | Payer: Commercial Managed Care - PPO | Source: Ambulatory Visit | Attending: Urology | Admitting: Urology

## 2017-05-13 DIAGNOSIS — I10 Essential (primary) hypertension: Secondary | ICD-10-CM | POA: Diagnosis not present

## 2017-05-13 DIAGNOSIS — Z79899 Other long term (current) drug therapy: Secondary | ICD-10-CM | POA: Insufficient documentation

## 2017-05-13 DIAGNOSIS — Z800 Family history of malignant neoplasm of digestive organs: Secondary | ICD-10-CM | POA: Diagnosis not present

## 2017-05-13 DIAGNOSIS — C61 Malignant neoplasm of prostate: Secondary | ICD-10-CM

## 2017-05-13 DIAGNOSIS — Z803 Family history of malignant neoplasm of breast: Secondary | ICD-10-CM | POA: Insufficient documentation

## 2017-05-13 DIAGNOSIS — Z8042 Family history of malignant neoplasm of prostate: Secondary | ICD-10-CM | POA: Diagnosis not present

## 2017-05-13 HISTORY — DX: Essential (primary) hypertension: I10

## 2017-05-13 HISTORY — DX: Benign prostatic hyperplasia with lower urinary tract symptoms: N40.1

## 2017-05-13 HISTORY — DX: Gastro-esophageal reflux disease without esophagitis: K21.9

## 2017-05-13 HISTORY — PX: RADIOACTIVE SEED IMPLANT: SHX5150

## 2017-05-13 HISTORY — PX: SPACE OAR INSTILLATION: SHX6769

## 2017-05-13 SURGERY — INSERTION, RADIATION SOURCE, PROSTATE
Anesthesia: General | Site: Prostate

## 2017-05-13 MED ORDER — PROPOFOL 10 MG/ML IV BOLUS
INTRAVENOUS | Status: AC
  Filled 2017-05-13: qty 20

## 2017-05-13 MED ORDER — ONDANSETRON HCL 4 MG/2ML IJ SOLN
INTRAMUSCULAR | Status: DC | PRN
  Administered 2017-05-13: 4 mg via INTRAVENOUS

## 2017-05-13 MED ORDER — LIDOCAINE 2% (20 MG/ML) 5 ML SYRINGE
INTRAMUSCULAR | Status: AC
  Filled 2017-05-13: qty 5

## 2017-05-13 MED ORDER — HYDROCODONE-ACETAMINOPHEN 10-325 MG PO TABS: 1.0000 | ORAL_TABLET | ORAL | 0 refills | Status: DC | PRN

## 2017-05-13 MED ORDER — CIPROFLOXACIN HCL 500 MG PO TABS: 500.0000 mg | ORAL_TABLET | Freq: Two times a day (BID) | ORAL | 0 refills | Status: DC

## 2017-05-13 MED ORDER — LACTATED RINGERS IV SOLN
INTRAVENOUS | Status: DC
  Administered 2017-05-13 (×3): via INTRAVENOUS
  Filled 2017-05-13: qty 1000

## 2017-05-13 MED ORDER — MIDAZOLAM HCL 2 MG/2ML IJ SOLN
INTRAMUSCULAR | Status: AC
  Filled 2017-05-13: qty 2

## 2017-05-13 MED ORDER — DEXAMETHASONE SODIUM PHOSPHATE 10 MG/ML IJ SOLN
INTRAMUSCULAR | Status: AC
  Filled 2017-05-13: qty 1

## 2017-05-13 MED ORDER — DEXAMETHASONE SODIUM PHOSPHATE 10 MG/ML IJ SOLN
INTRAMUSCULAR | Status: DC | PRN
  Administered 2017-05-13: 10 mg via INTRAVENOUS

## 2017-05-13 MED ORDER — PROMETHAZINE HCL 25 MG/ML IJ SOLN
6.2500 mg | INTRAMUSCULAR | Status: DC | PRN
  Filled 2017-05-13: qty 1

## 2017-05-13 MED ORDER — MEPERIDINE HCL 25 MG/ML IJ SOLN
6.2500 mg | INTRAMUSCULAR | Status: DC | PRN
  Filled 2017-05-13: qty 1

## 2017-05-13 MED ORDER — LIDOCAINE 2% (20 MG/ML) 5 ML SYRINGE
INTRAMUSCULAR | Status: DC | PRN
  Administered 2017-05-13: 100 mg via INTRAVENOUS

## 2017-05-13 MED ORDER — IOHEXOL 300 MG/ML  SOLN
INTRAMUSCULAR | Status: DC | PRN
  Administered 2017-05-13: 7 mL

## 2017-05-13 MED ORDER — FLEET ENEMA 7-19 GM/118ML RE ENEM
1.0000 | ENEMA | Freq: Once | RECTAL | Status: AC
  Administered 2017-05-13: 1 via RECTAL
  Filled 2017-05-13: qty 1

## 2017-05-13 MED ORDER — SODIUM CHLORIDE 0.9 % IJ SOLN
INTRAMUSCULAR | Status: DC | PRN
  Administered 2017-05-13: 10 mL

## 2017-05-13 MED ORDER — PROPOFOL 10 MG/ML IV BOLUS
INTRAVENOUS | Status: DC | PRN
  Administered 2017-05-13: 200 mg via INTRAVENOUS

## 2017-05-13 MED ORDER — FENTANYL CITRATE (PF) 100 MCG/2ML IJ SOLN
INTRAMUSCULAR | Status: DC | PRN
  Administered 2017-05-13: 50 ug via INTRAVENOUS
  Administered 2017-05-13 (×2): 25 ug via INTRAVENOUS

## 2017-05-13 MED ORDER — FENTANYL CITRATE (PF) 100 MCG/2ML IJ SOLN
INTRAMUSCULAR | Status: AC
  Filled 2017-05-13: qty 2

## 2017-05-13 MED ORDER — MIDAZOLAM HCL 5 MG/5ML IJ SOLN
INTRAMUSCULAR | Status: DC | PRN
  Administered 2017-05-13: 2 mg via INTRAVENOUS

## 2017-05-13 MED ORDER — ONDANSETRON HCL 4 MG/2ML IJ SOLN
INTRAMUSCULAR | Status: AC
  Filled 2017-05-13: qty 2

## 2017-05-13 MED ORDER — CIPROFLOXACIN IN D5W 400 MG/200ML IV SOLN
400.0000 mg | INTRAVENOUS | Status: AC
  Administered 2017-05-13: 400 mg via INTRAVENOUS
  Filled 2017-05-13: qty 200

## 2017-05-13 MED ORDER — SODIUM CHLORIDE 0.9 % IV SOLN
INTRAVENOUS | Status: AC | PRN
  Administered 2017-05-13: 1000 mL

## 2017-05-13 MED ORDER — FENTANYL CITRATE (PF) 100 MCG/2ML IJ SOLN
25.0000 ug | INTRAMUSCULAR | Status: DC | PRN
  Filled 2017-05-13: qty 1

## 2017-05-13 MED ORDER — HYDROCODONE-ACETAMINOPHEN 7.5-325 MG PO TABS
1.0000 | ORAL_TABLET | Freq: Once | ORAL | Status: DC | PRN
  Filled 2017-05-13: qty 1

## 2017-05-13 MED ORDER — CIPROFLOXACIN IN D5W 400 MG/200ML IV SOLN
INTRAVENOUS | Status: AC
  Filled 2017-05-13: qty 200

## 2017-05-13 SURGICAL SUPPLY — 33 items
BAG URINE DRAINAGE (UROLOGICAL SUPPLIES) ×3 IMPLANT
BLADE CLIPPER SURG (BLADE) ×3 IMPLANT
CATH FOLEY 2WAY SLVR  5CC 16FR (CATHETERS) ×1
CATH FOLEY 2WAY SLVR 5CC 16FR (CATHETERS) ×2 IMPLANT
CATH ROBINSON RED A/P 16FR (CATHETERS) IMPLANT
CATH ROBINSON RED A/P 20FR (CATHETERS) ×3 IMPLANT
CLOTH BEACON ORANGE TIMEOUT ST (SAFETY) ×3 IMPLANT
COVER BACK TABLE 60X90IN (DRAPES) ×3 IMPLANT
COVER MAYO STAND STRL (DRAPES) ×3 IMPLANT
DRSG TEGADERM 4X4.75 (GAUZE/BANDAGES/DRESSINGS) ×4 IMPLANT
DRSG TEGADERM 8X12 (GAUZE/BANDAGES/DRESSINGS) ×6 IMPLANT
GAUZE SPONGE 4X4 12PLY STRL LF (GAUZE/BANDAGES/DRESSINGS) ×2 IMPLANT
GLOVE BIO SURGEON STRL SZ8 (GLOVE) ×5 IMPLANT
GLOVE BIOGEL PI IND STRL 7.0 (GLOVE) IMPLANT
GLOVE BIOGEL PI IND STRL 7.5 (GLOVE) IMPLANT
GLOVE BIOGEL PI INDICATOR 7.0 (GLOVE) ×1
GLOVE BIOGEL PI INDICATOR 7.5 (GLOVE) ×3
GLOVE ECLIPSE 8.0 STRL XLNG CF (GLOVE) ×3 IMPLANT
GOWN STRL REUS W/TWL LRG LVL3 (GOWN DISPOSABLE) ×4 IMPLANT
GOWN STRL REUS W/TWL XL LVL3 (GOWN DISPOSABLE) ×3 IMPLANT
HOLDER FOLEY CATH W/STRAP (MISCELLANEOUS) IMPLANT
IMPL SPACEOAR SYSTEM 10ML (MISCELLANEOUS) ×2 IMPLANT
IMPLANT SPACEOAR SYSTEM 10ML (MISCELLANEOUS) ×3
IV NS 1000ML (IV SOLUTION) ×3
IV NS 1000ML BAXH (IV SOLUTION) ×2 IMPLANT
KIT TURNOVER CYSTO (KITS) ×3 IMPLANT
PACK CYSTO (CUSTOM PROCEDURE TRAY) ×3 IMPLANT
Radioactive Seed ×67 IMPLANT
SURGILUBE 2OZ TUBE FLIPTOP (MISCELLANEOUS) ×3 IMPLANT
SUT BONE WAX W31G (SUTURE) ×3 IMPLANT
SYRINGE 10CC LL (SYRINGE) IMPLANT
UNDERPAD 30X30 (UNDERPADS AND DIAPERS) ×6 IMPLANT
WATER STERILE IRR 500ML POUR (IV SOLUTION) ×3 IMPLANT

## 2017-05-13 NOTE — Discharge Instructions (Signed)

## 2017-05-13 NOTE — Transfer of Care (Signed)
Immediate Anesthesia Transfer of Care Note  Patient: Eric Leonard  Procedure(s) Performed: RADIOACTIVE SEED IMPLANT/BRACHYTHERAPY IMPLANT (N/A Prostate) SPACE OAR INSTILLATION (N/A )  Patient Location: PACU  Anesthesia Type:General  Level of Consciousness: awake, alert  and oriented  Airway & Oxygen Therapy: Patient Spontanous Breathing and Patient connected to nasal cannula oxygen  Post-op Assessment: Report given to RN and Post -op Vital signs reviewed and stable  Post vital signs: Reviewed and stable  Last Vitals:  Vitals Value Taken Time  BP 111/90 05/13/2017 11:42 AM  Temp    Pulse 69 05/13/2017 11:43 AM  Resp 11 05/13/2017 11:43 AM  SpO2 97 % 05/13/2017 11:43 AM  Vitals shown include unvalidated device data.  Last Pain:  Vitals:   05/13/17 0716  TempSrc: Oral      Patients Stated Pain Goal: 5 (64/68/03 2122)  Complications: No apparent anesthesia complications

## 2017-05-13 NOTE — Op Note (Signed)
PATIENT:  Eric Leonard  PRE-OPERATIVE DIAGNOSIS:  Adenocarcinoma of the prostate  POST-OPERATIVE DIAGNOSIS:  Same  PROCEDURE:  1. I-125 radioactive seed implantation 2. Cystoscopy  3. Placement of SpaceOAR  SURGEON:  Surgeon(s): Claybon Jabs  Radiation oncologist: Dr. Tyler Pita  ANESTHESIA:  General  EBL:  Minimal  DRAINS: None  INDICATION: [name] is a 57 year old male with biopsy-proven adenocarcinoma the prostate Gleason 4+3 with no abnormality noted on DRE.  In 1 core and 3+3 in 2 cores the treatment options were discussed at length and he has elected to proceed with radioactive seed implant.  Description of procedure: After informed consent the patient was brought to the major OR, placed on the table and administered general anesthesia. He was then moved to the modified lithotomy position with his perineum perpendicular to the floor. His perineum and genitalia were then sterilely prepped. An official timeout was then performed. A 16 French Foley catheter was then placed in the bladder and filled with dilute contrast, a rectal tube was placed in the rectum and the transrectal ultrasound probe was placed in the rectum and affixed to the stand. He was then sterilely draped.  Real time ultrasonography was used along with the seed planning software [employer] Prostate vs. 4.2.2.4. This was used to develop the seed plan including the number of needles as well as number of seeds required for complete and adequate coverage. Real-time ultrasonography was then used along with the previously developed plan and the Nucletron device to implant a total of 67 seeds using 22 needles. This proceeded without difficulty or complication.   I then proceeded with placement of SpaceOARby introducing a needle with the bevel angled inferiorly approximately 2 cm superior to the anus. This was angled downward and under direct ultrasound was placed within the space between the prostatic capsule and  rectum. This was confirmed with a small amount of sterile saline injected and this was performed under direct ultrasound. I then attached the SpaceOARto the needle and injected this in the space between the prostate and rectum with good placement noted.  A Foley catheter was then removed as well as the transrectal ultrasound probe and rectal probe. Flexible cystoscopy was then performed using the 17 French flexible scope which revealed a normal urethra throughout its length down to the sphincter which appeared intact. The prostatic urethra revealed bilobar hypertrophy but no evidence of obstruction, seeds, spacers or lesions. The bladder was then entered and fully and systematically inspected. The ureteral orifices were noted to be of normal configuration and position. The mucosa revealed no evidence of tumors. There were also no stones identified within the bladder. I noted no seeds or spacers on the floor of the bladder and retroflexion of the scope revealed no seeds protruding from the base of the prostate.  The cystoscope was then removed and the patient was awakened and taken to recovery room in stable and satisfactory condition. He tolerated procedure well and there were no intraoperative complications.

## 2017-05-13 NOTE — Anesthesia Procedure Notes (Signed)
Procedure Name: LMA Insertion Date/Time: 05/13/2017 10:06 AM Performed by: Genelle Bal, CRNA Pre-anesthesia Checklist: Patient identified, Emergency Drugs available, Suction available and Patient being monitored Patient Re-evaluated:Patient Re-evaluated prior to induction Oxygen Delivery Method: Circle system utilized Preoxygenation: Pre-oxygenation with 100% oxygen Induction Type: IV induction Ventilation: Mask ventilation without difficulty LMA: LMA inserted LMA Size: 5.0 Number of attempts: 1 Airway Equipment and Method: Bite block Placement Confirmation: positive ETCO2 Tube secured with: Tape Dental Injury: Teeth and Oropharynx as per pre-operative assessment

## 2017-05-13 NOTE — Anesthesia Preprocedure Evaluation (Signed)
Anesthesia Evaluation  Patient identified by MRN, date of birth, ID band Patient awake    Reviewed: Allergy & Precautions, NPO status , Patient's Chart, lab work & pertinent test results  Airway Mallampati: II  TM Distance: >3 FB Neck ROM: Full    Dental no notable dental hx. (+) Teeth Intact   Pulmonary neg pulmonary ROS,    Pulmonary exam normal breath sounds clear to auscultation       Cardiovascular hypertension, Pt. on medications Normal cardiovascular exam Rhythm:Regular Rate:Normal     Neuro/Psych negative neurological ROS  negative psych ROS   GI/Hepatic Neg liver ROS, GERD  Medicated and Controlled,  Endo/Other  negative endocrine ROS  Renal/GU    Prostate Ca negative genitourinary   Musculoskeletal negative musculoskeletal ROS (+)   Abdominal   Peds  Hematology negative hematology ROS (+)   Anesthesia Other Findings   Reproductive/Obstetrics                             Anesthesia Physical Anesthesia Plan  ASA: II  Anesthesia Plan: General   Post-op Pain Management:    Induction: Intravenous  PONV Risk Score and Plan: 4 or greater and Midazolam, Ondansetron, Dexamethasone, Treatment may vary due to age or medical condition and Scopolamine patch - Pre-op  Airway Management Planned: LMA  Additional Equipment:   Intra-op Plan:   Post-operative Plan: Extubation in OR  Informed Consent: I have reviewed the patients History and Physical, chart, labs and discussed the procedure including the risks, benefits and alternatives for the proposed anesthesia with the patient or authorized representative who has indicated his/her understanding and acceptance.   Dental advisory given  Plan Discussed with: CRNA, Anesthesiologist and Surgeon  Anesthesia Plan Comments:         Anesthesia Quick Evaluation

## 2017-05-13 NOTE — Anesthesia Postprocedure Evaluation (Signed)
Anesthesia Post Note  Patient: Eric Leonard  Procedure(s) Performed: RADIOACTIVE SEED IMPLANT/BRACHYTHERAPY IMPLANT (N/A Prostate) SPACE OAR INSTILLATION (N/A )     Patient location during evaluation: PACU Anesthesia Type: General Level of consciousness: awake and alert and oriented Pain management: pain level controlled Vital Signs Assessment: post-procedure vital signs reviewed and stable Respiratory status: spontaneous breathing, nonlabored ventilation and respiratory function stable Cardiovascular status: blood pressure returned to baseline and stable Postop Assessment: no apparent nausea or vomiting Anesthetic complications: no    Last Vitals:  Vitals:   05/13/17 1200 05/13/17 1215  BP: 128/82 130/89  Pulse: 69 66  Resp: 20 14  Temp:    SpO2: 98% 97%    Last Pain:  Vitals:   05/13/17 1215  TempSrc:   PainSc: 3                  Doss Cybulski A.

## 2017-05-14 ENCOUNTER — Encounter (HOSPITAL_BASED_OUTPATIENT_CLINIC_OR_DEPARTMENT_OTHER): Payer: Self-pay | Admitting: Urology

## 2017-05-15 ENCOUNTER — Telehealth: Payer: Self-pay | Admitting: *Deleted

## 2017-05-15 NOTE — Telephone Encounter (Signed)
Patient to have MRI @ [facility] on 05-24-17 @ 5 pm

## 2017-05-16 NOTE — Progress Notes (Signed)
  Radiation Oncology         [phone]) [phone] ________________________________  Name: Eric Leonard MRN: [id number]  Date: 05/16/2017  DOB: 01-29-1960       Prostate Seed Implant  QI:ONGEXB, Pcp Not In  No ref. provider found  DIAGNOSIS: 57 y.o. gentleman with Stage T1c adenocarcinoma of the prostate with Gleason Score of 4+3, and PSA of 5.13    ICD-10-CM   1. Malignant neoplasm of prostate ([place]) [place] Discharge patient  2. Prostate cancer ([place]) C61 CANCELED: DG Chest 2 View    CANCELED: DG Chest 2 View    PROCEDURE: Insertion of radioactive I-125 seeds into the prostate gland.  RADIATION DOSE: 145 Gy, definitive therapy.  TECHNIQUE: Tajay Muzzy was brought to the operating room with the urologist. He was placed in the dorsolithotomy position. He was catheterized and a rectal tube was inserted. The perineum was shaved, prepped and draped. The ultrasound probe was then introduced into the rectum to see the prostate gland.  TREATMENT DEVICE: A needle grid was attached to the ultrasound probe stand and anchor needles were placed.  3D PLANNING: The prostate was imaged in 3D using a sagittal sweep of the prostate probe. These images were transferred to the planning computer. There, the prostate, urethra and rectum were defined on each axial reconstructed image. Then, the software created an optimized 3D plan and a few seed positions were adjusted. The quality of the plan was reviewed using [facility] information for the target and the following two organs at risk:  Urethra and Rectum.  Then the accepted plan was uploaded to the seed Selectron afterloading unit.  PROSTATE VOLUME STUDY:  Using transrectal ultrasound the volume of the prostate was verified to be 53.2 cc.  SPECIAL TREATMENT PROCEDURE/SUPERVISION AND HANDLING: The Nucletron FIRST system was used to place the needles under sagittal guidance. A total of 22 needles were used to deposit 67 seeds in the prostate gland. The individual seed activity  was 0.565 mCi.  SpaceOAR:  Yes  COMPLEX SIMULATION: At the end of the procedure, an anterior radiograph of the pelvis was obtained to document seed positioning and count. Cystoscopy was performed to check the urethra and bladder.  MICRODOSIMETRY: At the end of the procedure, the patient was emitting 0.100 mR/hr at 1 meter. Accordingly, he was considered safe for hospital discharge.  PLAN: The patient will return to the radiation oncology clinic for post implant CT dosimetry in three weeks.   ________________________________  Sheral Apley Tammi Klippel, M.D.

## 2017-05-23 ENCOUNTER — Telehealth: Payer: Self-pay | Admitting: *Deleted

## 2017-05-23 NOTE — Telephone Encounter (Signed)
Called patient to remind of post seed appts. and MRI for 05-24-17, spoke with patient and he is aware of these appts.

## 2017-05-24 ENCOUNTER — Ambulatory Visit
Admission: RE | Admit: 2017-05-24 | Discharge: 2017-05-24 | Disposition: A | Discharge status: Home / Self Care | Payer: Commercial Managed Care - PPO | Source: Ambulatory Visit | Attending: Radiation Oncology | Admitting: Radiation Oncology

## 2017-05-24 ENCOUNTER — Ambulatory Visit (HOSPITAL_COMMUNITY)
Admission: RE | Admit: 2017-05-24 | Discharge: 2017-05-24 | Disposition: A | Discharge status: Home / Self Care | Payer: Commercial Managed Care - PPO | Source: Ambulatory Visit | Attending: Urology | Admitting: Urology

## 2017-05-24 ENCOUNTER — Encounter: Payer: Self-pay | Admitting: Medical Oncology

## 2017-05-24 VITALS — BP 125/89 | HR 63 | Temp 98.1°F | Resp 18 | Ht 75.0 in

## 2017-05-24 DIAGNOSIS — R3911 Hesitancy of micturition: Secondary | ICD-10-CM | POA: Diagnosis not present

## 2017-05-24 DIAGNOSIS — C61 Malignant neoplasm of prostate: Secondary | ICD-10-CM

## 2017-05-24 DIAGNOSIS — Z510 Encounter for antineoplastic radiation therapy: Secondary | ICD-10-CM | POA: Insufficient documentation

## 2017-05-24 DIAGNOSIS — Z923 Personal history of irradiation: Secondary | ICD-10-CM | POA: Diagnosis not present

## 2017-05-24 DIAGNOSIS — Z79899 Other long term (current) drug therapy: Secondary | ICD-10-CM | POA: Diagnosis not present

## 2017-05-24 NOTE — Progress Notes (Signed)
Radiation Oncology         [phone]) [phone] ________________________________  Name: Eric Leonard MRN: [id number]  Date: 05/24/2017  DOB: 1960/03/13  Follow-Up Visit Note  CC: System, Pcp Not In  Kathie Rhodes, MD  Diagnosis:   57 y.o. gentleman with stage T1c adenocarcinoma of the prostate with Gleason score of 4+3, and PSA of 5.13.  No diagnosis found.  Interval Since Last Radiation:  1.5 weeks  05/13/2017:  Insertion of radioactive I-125 seeds into the prostate gland; 145 Gy, definitive therapy with insertion of SpaceOAR gel.  Narrative:  The patient returns today for routine follow-up.  He is complaining of increased urinary frequency and urinary hesitation symptoms. He filled out a questionnaire regarding urinary function today providing and overall IPSS score of 16 characterizing his symptoms as moderate.  His pre-implant score was 4. He reports a persistent weak stream and urgency are his greatest concerns. He specifically denies dysuria, hematuria, leakage or incontinence. He denies any bowel symptoms.  ALLERGIES:  has No Known Allergies.  Meds: Current Outpatient Medications  Medication Sig Dispense Refill  . calcium-vitamin D (OSCAL WITH D) 500-200 MG-UNIT tablet Take 1 tablet by mouth daily.     . ciprofloxacin (CIPRO) 500 MG tablet Take 1 tablet (500 mg total) by mouth 2 (two) times daily. 6 tablet 0  . hydrochlorothiazide (HYDRODIURIL) 25 MG tablet Take 25 mg by mouth every evening.    Marland Kitchen HYDROcodone-acetaminophen (NORCO) 10-325 MG tablet Take 1-2 tablets by mouth every 4 (four) hours as needed for moderate pain. Maximum dose per 24 hours - 8 pills 8 tablet 0  . Multiple Vitamin (MULTIVITAMIN) tablet Take 1 tablet by mouth daily.     No current facility-administered medications for this encounter.     Physical Findings: The patient is in no acute distress. Patient is alert and oriented.  vitals were not taken for this visit. .  No significant changes.  Lab Findings: Lab  Results  Component Value Date   WBC 5.5 05/06/2017   HGB 15.4 05/06/2017   HCT 44.4 05/06/2017   MCV 92.1 05/06/2017   PLT 200 05/06/2017    Radiographic Findings:  Patient underwent CT imaging in our clinic for post implant dosimetry. The CT was person ally reviewed by Dr. Tammi Klippel and appears to demonstrate an adequate distribution of radioactive seeds throughout the prostate gland. There are no seeds in or near the rectum. He is scheduled for prostate MRI later today and these images will be fused with his CT images for further review. We suspect the final radiation plan and dosimetry will show appropriate coverage of the prostate gland.   Impression: The patient is recovering from the effects of radiation. His urinary symptoms should gradually improve over the next 4-6 months. We talked about this today. He is encouraged by his improvement already and is otherwise pleased with his outcome.   Plan: Today, I spent time talking to the patient about his prostate seed implant and resolving urinary symptoms. We also talked about long-term follow-up for prostate cancer following seed implant. He has a follow up appointment with Dr. Karsten Ro on 06/03/17. He understands that ongoing PSA determinations and digital rectal exams will help perform surveillance to rule out disease recurrence. He understands what to expect with his PSA measures. Patient was also educated today about some of the long-term effects from radiation including a small risk for rectal bleeding and possibly erectile dysfunction. We talked about some of the general management approaches to these potential complications.  However, I did encourage the patient to contact our office or return at any point if he has questions or concerns related to his previous radiation and prostate cancer.    Nicholos Johns, PA-C    Tyler Pita, MD  [facility] Direct Dial: [phone]  Fax: [phone] [email or web address]   Skype  [email or web address]  This document serves as a record of services personally performed by Tyler Pita, MD and Freeman Caldron, PA-C. It was created on their behalf by Arlyce Harman, a trained medical scribe. The creation of this record is based on the scribe's personal observations and the provider's statements to them. This document has been checked and approved by the attending provider.

## 2017-05-24 NOTE — Progress Notes (Signed)
Weight and vitals stable. Denies pain. Pre seed IPSS 4. Post seed IPSS 16. Reports weak stream and urgency are his greatest concerns. Denies dysuria, hematuria, leakage or incontinence. Scheduled for MRI today at 5pm to confirm Martha placement. Scheduled to follow up with Dr. Karsten Ro on 06/03/17.    BP 125/89   Pulse 63   Temp 98.1 F (36.7 C) (Oral)   Resp 18   Wt 212 lb 12.8 oz (96.5 kg)   SpO2 99%   BMI 26.60 kg/m  Wt Readings from Last 3 Encounters:  05/24/17 212 lb 12.8 oz (96.5 kg)  05/13/17 210 lb (95.3 kg)  02/25/17 218 lb 9.6 oz (99.2 kg)

## 2017-05-24 NOTE — Progress Notes (Signed)
  Radiation Oncology         [phone]) [phone] ________________________________  Name: Eric Leonard MRN: [id number]  Date: 05/24/2017  DOB: Dec 21, 1960  COMPLEX SIMULATION NOTE  NARRATIVE:  The patient was brought to the [facility] today following prostate seed implantation approximately one month ago.  Identity was confirmed.  All relevant records and images related to the planned course of therapy were reviewed.  Then, the patient was set-up supine.  CT images were obtained.  The CT images were loaded into the planning software.  Then the prostate and rectum were contoured.  Treatment planning then occurred.  The implanted iodine 125 seeds were identified by the physics staff for projection of radiation distribution  I have requested : 3D Simulation  I have requested a DVH of the following structures: Prostate and rectum.    ________________________________  Sheral Apley Tammi Klippel, M.D.  This document serves as a record of services personally performed by Tyler Pita, MD. It was created on his behalf by Arlyce Harman, a trained medical scribe. The creation of this record is based on the scribe's personal observations and the provider's statements to them. This document has been checked and approved by the attending provider.

## 2017-06-20 ENCOUNTER — Encounter: Payer: Self-pay | Admitting: Radiation Oncology

## 2017-06-20 ENCOUNTER — Ambulatory Visit: Payer: Commercial Managed Care - PPO | Attending: Radiation Oncology | Admitting: Radiation Oncology

## 2017-06-20 DIAGNOSIS — C61 Malignant neoplasm of prostate: Secondary | ICD-10-CM | POA: Diagnosis not present

## 2017-06-29 NOTE — Progress Notes (Signed)
  Radiation Oncology         [phone]) [phone] ________________________________  Name: Eric Leonard MRN: [id number]  Date: 06/20/2017  DOB: 10/29/1960  3D Planning Note   Prostate Brachytherapy Post-Implant Dosimetry  Diagnosis: 57 y.o. gentleman with stage T1c adenocarcinoma of the prostate with Gleason score of 4+3, and PSA of 5.13.  Narrative: On a previous date, Eric Leonard returned following prostate seed implantation for post implant planning. He underwent CT scan complex simulation to delineate the three-dimensional structures of the pelvis and demonstrate the radiation distribution.  Since that time, the seed localization, and complex isodose planning with dose volume histograms have now been completed.  Results:   Prostate Coverage - The dose of radiation delivered to the 90% or more of the prostate gland (D90) was 107.25% of the prescription dose. This exceeds our goal of greater than 90%. Rectal Sparing - The volume of rectal tissue receiving the prescription dose or higher was 0.0 cc. This falls under our thresholds tolerance of 1.0 cc.  Impression: The prostate seed implant appears to show adequate target coverage and appropriate rectal sparing.  Plan:  The patient will continue to follow with urology for ongoing PSA determinations. I would anticipate a high likelihood for local tumor control with minimal risk for rectal morbidity.  ________________________________  Sheral Apley Tammi Klippel, M.D.

## 2017-07-23 DIAGNOSIS — I10 Essential (primary) hypertension: Secondary | ICD-10-CM | POA: Diagnosis not present

## 2017-07-23 DIAGNOSIS — E559 Vitamin D deficiency, unspecified: Secondary | ICD-10-CM | POA: Diagnosis not present

## 2017-07-23 DIAGNOSIS — E119 Type 2 diabetes mellitus without complications: Secondary | ICD-10-CM | POA: Diagnosis not present

## 2017-08-27 DIAGNOSIS — C61 Malignant neoplasm of prostate: Secondary | ICD-10-CM | POA: Diagnosis not present

## 2017-09-03 DIAGNOSIS — Z8546 Personal history of malignant neoplasm of prostate: Secondary | ICD-10-CM | POA: Diagnosis not present

## 2017-09-10 DIAGNOSIS — E119 Type 2 diabetes mellitus without complications: Secondary | ICD-10-CM | POA: Diagnosis not present

## 2017-09-10 DIAGNOSIS — E559 Vitamin D deficiency, unspecified: Secondary | ICD-10-CM | POA: Diagnosis not present

## 2017-09-10 DIAGNOSIS — I10 Essential (primary) hypertension: Secondary | ICD-10-CM | POA: Diagnosis not present

## 2017-09-24 DIAGNOSIS — I10 Essential (primary) hypertension: Secondary | ICD-10-CM | POA: Diagnosis not present

## 2017-09-24 DIAGNOSIS — E119 Type 2 diabetes mellitus without complications: Secondary | ICD-10-CM | POA: Diagnosis not present

## 2017-09-24 DIAGNOSIS — N529 Male erectile dysfunction, unspecified: Secondary | ICD-10-CM | POA: Diagnosis not present

## 2017-09-24 DIAGNOSIS — E559 Vitamin D deficiency, unspecified: Secondary | ICD-10-CM | POA: Diagnosis not present

## 2018-02-27 DIAGNOSIS — Z8546 Personal history of malignant neoplasm of prostate: Secondary | ICD-10-CM | POA: Diagnosis not present

## 2018-03-06 DIAGNOSIS — N400 Enlarged prostate without lower urinary tract symptoms: Secondary | ICD-10-CM | POA: Diagnosis not present

## 2018-03-06 DIAGNOSIS — Z8546 Personal history of malignant neoplasm of prostate: Secondary | ICD-10-CM | POA: Diagnosis not present

## 2018-04-24 DIAGNOSIS — Z01118 Encounter for examination of ears and hearing with other abnormal findings: Secondary | ICD-10-CM | POA: Diagnosis not present

## 2018-04-24 DIAGNOSIS — Z136 Encounter for screening for cardiovascular disorders: Secondary | ICD-10-CM | POA: Diagnosis not present

## 2018-04-24 DIAGNOSIS — I10 Essential (primary) hypertension: Secondary | ICD-10-CM | POA: Diagnosis not present

## 2018-04-24 DIAGNOSIS — Z01021 Encounter for examination of eyes and vision following failed vision screening with abnormal findings: Secondary | ICD-10-CM | POA: Diagnosis not present

## 2018-04-24 DIAGNOSIS — E119 Type 2 diabetes mellitus without complications: Secondary | ICD-10-CM | POA: Diagnosis not present

## 2018-04-24 DIAGNOSIS — E559 Vitamin D deficiency, unspecified: Secondary | ICD-10-CM | POA: Diagnosis not present

## 2018-04-24 DIAGNOSIS — Z5181 Encounter for therapeutic drug level monitoring: Secondary | ICD-10-CM | POA: Diagnosis not present

## 2018-04-24 DIAGNOSIS — Z131 Encounter for screening for diabetes mellitus: Secondary | ICD-10-CM | POA: Diagnosis not present

## 2018-04-24 DIAGNOSIS — Z0001 Encounter for general adult medical examination with abnormal findings: Secondary | ICD-10-CM | POA: Diagnosis not present

## 2020-02-28 IMAGING — DX DG CHEST 2V
2 series · 2 of 2 positions shown · non-contrast
Comparison: None.

CLINICAL DATA: Preoperative evaluation.

EXAM:
CHEST - 2 VIEW

[chest pa]
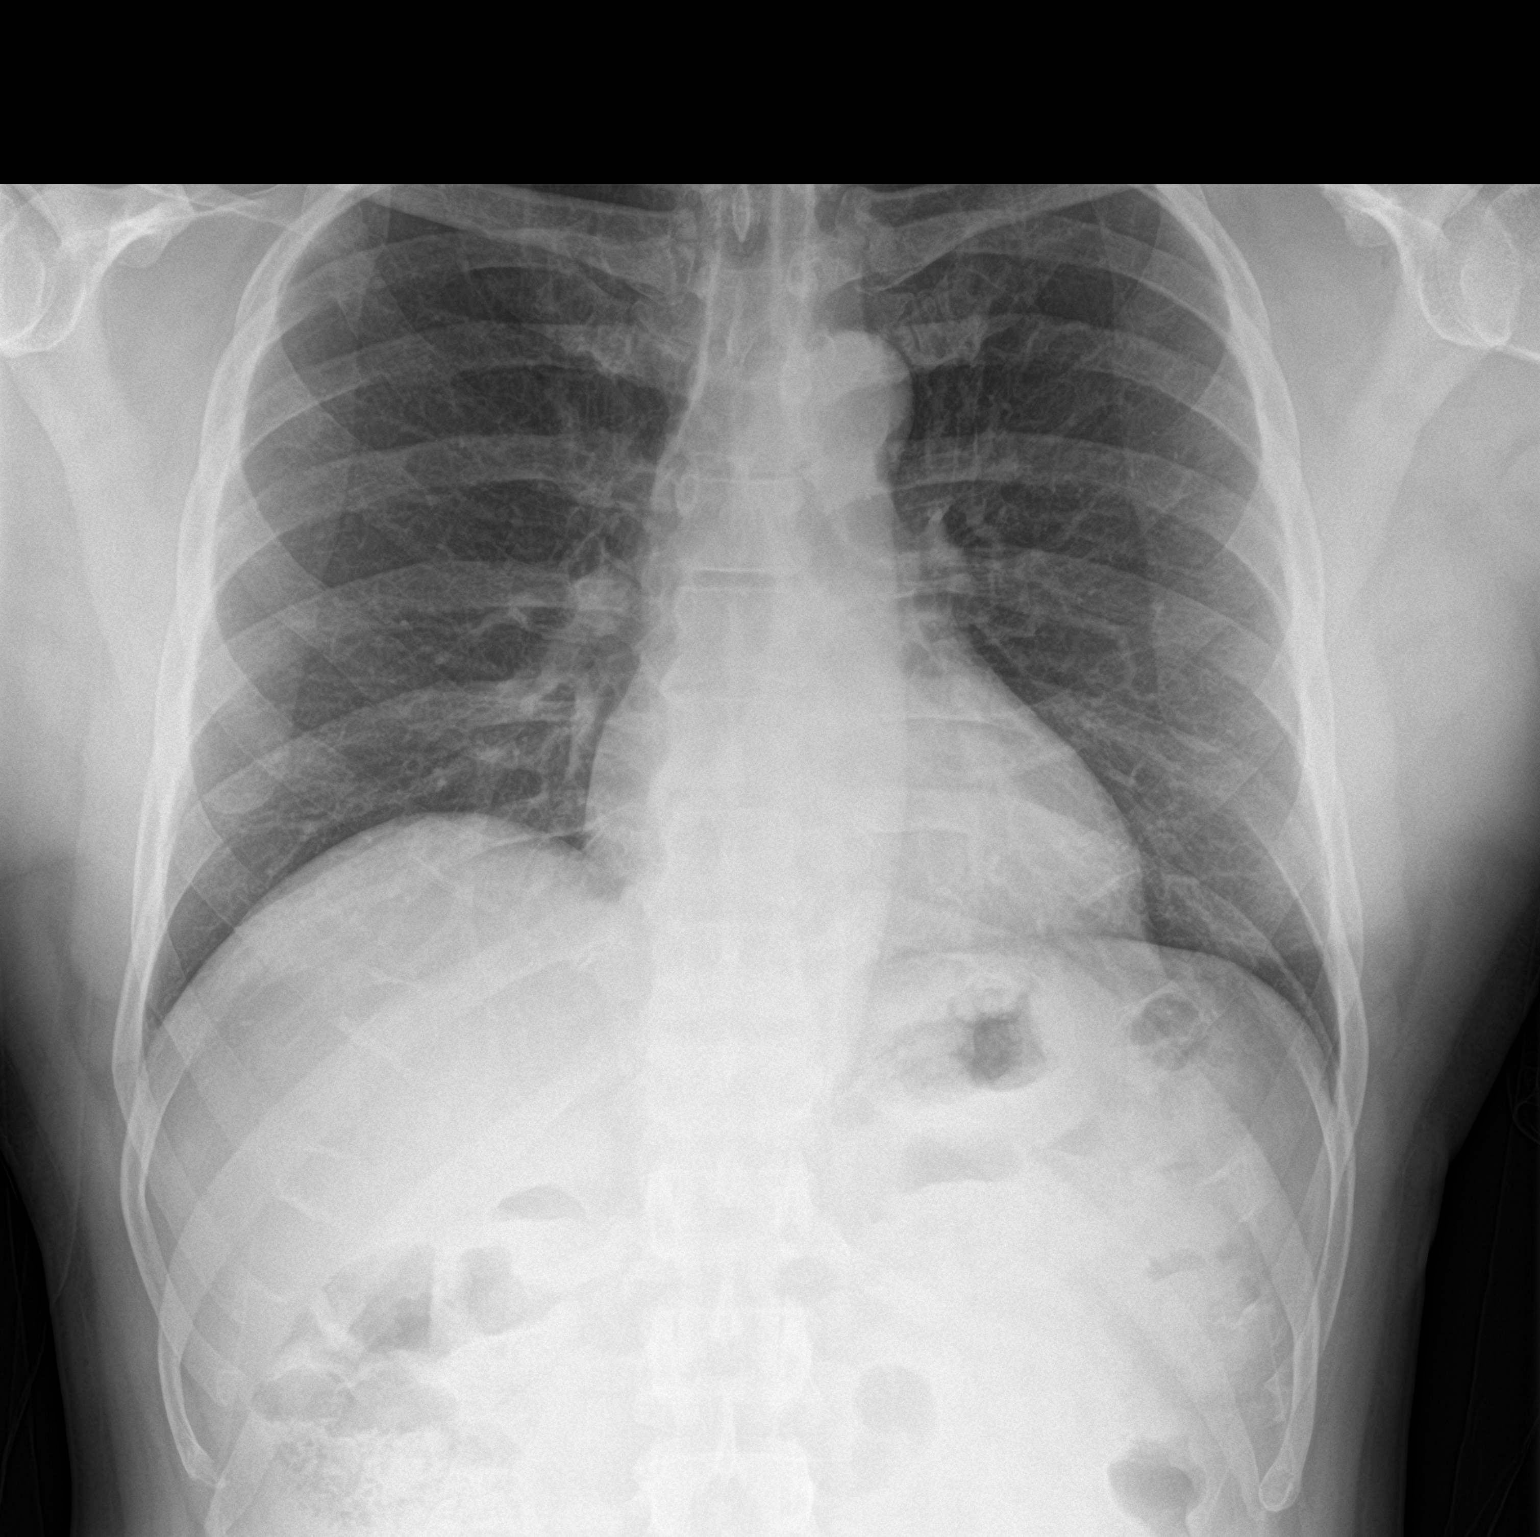

[chest lat]
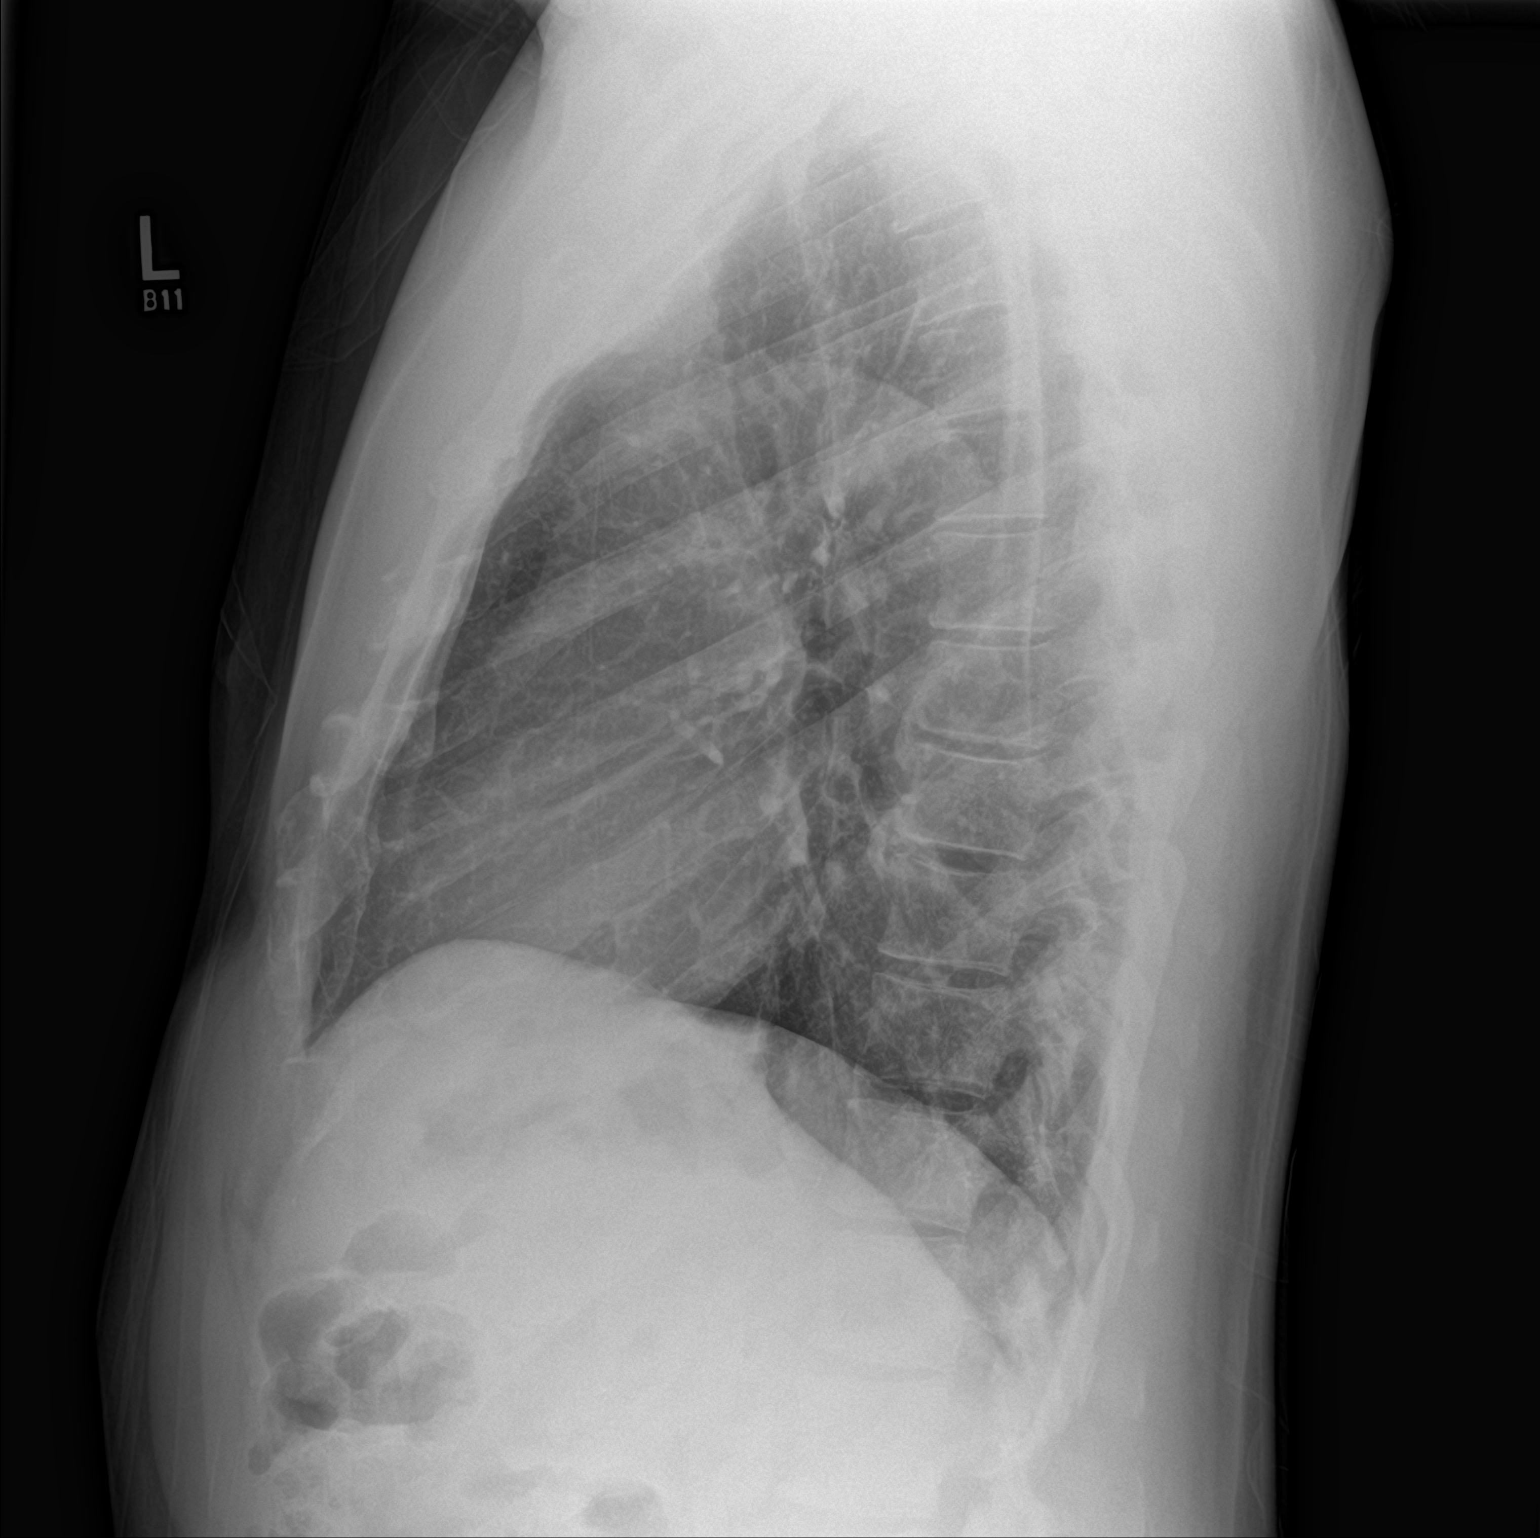

[2 of 2 positions shown; findings below may reference images not displayed]

FINDINGS: The heart size and mediastinal contours are within normal limits.
Both lungs are clear. The visualized skeletal structures are
unremarkable.
IMPRESSION: No active cardiopulmonary disease.

## 2021-02-27 DIAGNOSIS — Z8546 Personal history of malignant neoplasm of prostate: Secondary | ICD-10-CM | POA: Diagnosis not present

## 2021-03-07 DIAGNOSIS — Z8546 Personal history of malignant neoplasm of prostate: Secondary | ICD-10-CM | POA: Diagnosis not present

## 2021-03-07 DIAGNOSIS — R350 Frequency of micturition: Secondary | ICD-10-CM | POA: Diagnosis not present

## 2021-03-07 DIAGNOSIS — N5201 Erectile dysfunction due to arterial insufficiency: Secondary | ICD-10-CM | POA: Diagnosis not present

## 2021-06-11 ENCOUNTER — Other Ambulatory Visit: Payer: Self-pay

## 2021-06-11 ENCOUNTER — Inpatient Hospital Stay (HOSPITAL_COMMUNITY)
Admission: EM | Admit: 2021-06-11 | Discharge: 2021-06-13 | DRG: 638 | Disposition: A | Discharge status: Home / Self Care | Payer: BC Managed Care – PPO | Source: Ambulatory Visit | Attending: Internal Medicine | Admitting: Internal Medicine

## 2021-06-11 ENCOUNTER — Emergency Department (HOSPITAL_COMMUNITY): Payer: BC Managed Care – PPO

## 2021-06-11 ENCOUNTER — Encounter (HOSPITAL_COMMUNITY): Payer: Self-pay

## 2021-06-11 DIAGNOSIS — Z79899 Other long term (current) drug therapy: Secondary | ICD-10-CM | POA: Diagnosis not present

## 2021-06-11 DIAGNOSIS — E1165 Type 2 diabetes mellitus with hyperglycemia: Secondary | ICD-10-CM | POA: Diagnosis not present

## 2021-06-11 DIAGNOSIS — N179 Acute kidney failure, unspecified: Secondary | ICD-10-CM

## 2021-06-11 DIAGNOSIS — E119 Type 2 diabetes mellitus without complications: Secondary | ICD-10-CM | POA: Diagnosis not present

## 2021-06-11 DIAGNOSIS — Z923 Personal history of irradiation: Secondary | ICD-10-CM

## 2021-06-11 DIAGNOSIS — R739 Hyperglycemia, unspecified: Principal | ICD-10-CM

## 2021-06-11 DIAGNOSIS — E1110 Type 2 diabetes mellitus with ketoacidosis without coma: Secondary | ICD-10-CM | POA: Diagnosis not present

## 2021-06-11 DIAGNOSIS — Z800 Family history of malignant neoplasm of digestive organs: Secondary | ICD-10-CM | POA: Diagnosis not present

## 2021-06-11 DIAGNOSIS — Z8042 Family history of malignant neoplasm of prostate: Secondary | ICD-10-CM

## 2021-06-11 DIAGNOSIS — Z8546 Personal history of malignant neoplasm of prostate: Secondary | ICD-10-CM | POA: Diagnosis not present

## 2021-06-11 DIAGNOSIS — E1100 Type 2 diabetes mellitus with hyperosmolarity without nonketotic hyperglycemic-hyperosmolar coma (NKHHC): Secondary | ICD-10-CM | POA: Diagnosis present

## 2021-06-11 DIAGNOSIS — R000 Tachycardia, unspecified: Secondary | ICD-10-CM | POA: Diagnosis not present

## 2021-06-11 DIAGNOSIS — Z803 Family history of malignant neoplasm of breast: Secondary | ICD-10-CM | POA: Diagnosis not present

## 2021-06-11 DIAGNOSIS — E785 Hyperlipidemia, unspecified: Secondary | ICD-10-CM

## 2021-06-11 DIAGNOSIS — R300 Dysuria: Secondary | ICD-10-CM | POA: Diagnosis not present

## 2021-06-11 DIAGNOSIS — K429 Umbilical hernia without obstruction or gangrene: Secondary | ICD-10-CM | POA: Diagnosis not present

## 2021-06-11 DIAGNOSIS — I10 Essential (primary) hypertension: Secondary | ICD-10-CM

## 2021-06-11 DIAGNOSIS — R109 Unspecified abdominal pain: Secondary | ICD-10-CM | POA: Diagnosis present

## 2021-06-11 DIAGNOSIS — K4090 Unilateral inguinal hernia, without obstruction or gangrene, not specified as recurrent: Secondary | ICD-10-CM | POA: Diagnosis not present

## 2021-06-11 DIAGNOSIS — R8290 Unspecified abnormal findings in urine: Secondary | ICD-10-CM | POA: Diagnosis present

## 2021-06-11 DIAGNOSIS — C61 Malignant neoplasm of prostate: Secondary | ICD-10-CM | POA: Diagnosis present

## 2021-06-11 DIAGNOSIS — N2882 Megaloureter: Secondary | ICD-10-CM | POA: Diagnosis not present

## 2021-06-11 LAB — URINALYSIS, ROUTINE W REFLEX MICROSCOPIC
Bacteria, UA: NONE SEEN
Bilirubin Urine: NEGATIVE
Glucose, UA: >=500 mg/dL — AB
Hgb urine dipstick: NEGATIVE
Ketones, ur: 20 mg/dL — AB
Nitrite: NEGATIVE
Protein, ur: NEGATIVE mg/dL
Specific Gravity, Urine: 1.023 (ref 1.005–1.030)
WBC, UA: >50 WBC/hpf — ABNORMAL HIGH (ref 0–5)
pH: 6 (ref 5.0–8.0)

## 2021-06-11 LAB — COMPREHENSIVE METABOLIC PANEL
ALT: 31 U/L (ref 0–44)
AST: 21 U/L (ref 15–41)
Albumin: 3.8 g/dL (ref 3.5–5.0)
Alkaline Phosphatase: 103 U/L (ref 38–126)
Anion gap: 16 — ABNORMAL HIGH (ref 5–15)
BUN: 28 mg/dL — ABNORMAL HIGH (ref 6–20)
CO2: 21 mmol/L — ABNORMAL LOW (ref 22–32)
Calcium: 10 mg/dL (ref 8.9–10.3)
Chloride: 99 mmol/L (ref 98–111)
Creatinine, Ser: 1.44 mg/dL — ABNORMAL HIGH (ref 0.61–1.24)
GFR, Estimated: 56 mL/min — ABNORMAL LOW (ref >60)
Glucose, Bld: 621 mg/dL (ref 70–99)
Potassium: 4.3 mmol/L (ref 3.5–5.1)
Sodium: 136 mmol/L (ref 135–145)
Total Bilirubin: 1.5 mg/dL — ABNORMAL HIGH (ref 0.3–1.2)
Total Protein: 7.7 g/dL (ref 6.5–8.1)

## 2021-06-11 LAB — CBC WITH DIFFERENTIAL/PLATELET
Abs Immature Granulocytes: 0.01 10*3/uL (ref 0.00–0.07)
Basophils Absolute: 0 10*3/uL (ref 0.0–0.1)
Basophils Relative: 1 %
Eosinophils Absolute: 0 10*3/uL (ref 0.0–0.5)
Eosinophils Relative: 0 %
HCT: 45.2 % (ref 39.0–52.0)
Hemoglobin: 16.3 g/dL (ref 13.0–17.0)
Immature Granulocytes: 0 %
Lymphocytes Relative: 32 %
Lymphs Abs: 1.5 10*3/uL (ref 0.7–4.0)
MCH: 32.4 pg (ref 26.0–34.0)
MCHC: 36.1 g/dL — ABNORMAL HIGH (ref 30.0–36.0)
MCV: 89.9 fL (ref 80.0–100.0)
Monocytes Absolute: 0.7 10*3/uL (ref 0.1–1.0)
Monocytes Relative: 14 %
Neutro Abs: 2.6 10*3/uL (ref 1.7–7.7)
Neutrophils Relative %: 53 %
Platelets: 285 10*3/uL (ref 150–400)
RBC: 5.03 MIL/uL (ref 4.22–5.81)
RDW: 12.5 % (ref 11.5–15.5)
WBC: 4.8 10*3/uL (ref 4.0–10.5)
nRBC: 0 % (ref 0.0–0.2)

## 2021-06-11 LAB — BASIC METABOLIC PANEL
Anion gap: 15 (ref 5–15)
Anion gap: 6 (ref 5–15)
Anion gap: 5 (ref 5–15)
BUN: 22 mg/dL — ABNORMAL HIGH (ref 6–20)
BUN: 19 mg/dL (ref 6–20)
BUN: 18 mg/dL (ref 6–20)
CO2: 22 mmol/L (ref 22–32)
CO2: 26 mmol/L (ref 22–32)
CO2: 27 mmol/L (ref 22–32)
Calcium: 9.2 mg/dL (ref 8.9–10.3)
Calcium: 9.1 mg/dL (ref 8.9–10.3)
Calcium: 8.4 mg/dL — ABNORMAL LOW (ref 8.9–10.3)
Chloride: 106 mmol/L (ref 98–111)
Chloride: 113 mmol/L — ABNORMAL HIGH (ref 98–111)
Chloride: 110 mmol/L (ref 98–111)
Creatinine, Ser: 1.12 mg/dL (ref 0.61–1.24)
Creatinine, Ser: 0.9 mg/dL (ref 0.61–1.24)
Creatinine, Ser: 0.99 mg/dL (ref 0.61–1.24)
GFR, Estimated: >60 mL/min (ref >60)
GFR, Estimated: >60 mL/min (ref >60)
GFR, Estimated: >60 mL/min (ref >60)
Glucose, Bld: 417 mg/dL — ABNORMAL HIGH (ref 70–99)
Glucose, Bld: 142 mg/dL — ABNORMAL HIGH (ref 70–99)
Glucose, Bld: 201 mg/dL — ABNORMAL HIGH (ref 70–99)
Potassium: 4.4 mmol/L (ref 3.5–5.1)
Potassium: 3.6 mmol/L (ref 3.5–5.1)
Potassium: 3.2 mmol/L — ABNORMAL LOW (ref 3.5–5.1)
Sodium: 143 mmol/L (ref 135–145)
Sodium: 145 mmol/L (ref 135–145)
Sodium: 142 mmol/L (ref 135–145)

## 2021-06-11 LAB — BLOOD GAS, VENOUS
Acid-base deficit: 2 mmol/L (ref 0.0–2.0)
Bicarbonate: 22.3 mmol/L (ref 20.0–28.0)
O2 Saturation: 95.7 %
Patient temperature: 37
pCO2, Ven: 36 mmHg — ABNORMAL LOW (ref 44–60)
pH, Ven: 7.4 (ref 7.25–7.43)
pO2, Ven: 72 mmHg — ABNORMAL HIGH (ref 32–45)

## 2021-06-11 LAB — TSH: TSH: 1.781 u[IU]/mL (ref 0.350–4.500)

## 2021-06-11 LAB — BETA-HYDROXYBUTYRIC ACID
Beta-Hydroxybutyric Acid: 4.13 mmol/L — ABNORMAL HIGH (ref 0.05–0.27)
Beta-Hydroxybutyric Acid: 4 mmol/L — ABNORMAL HIGH (ref 0.05–0.27)

## 2021-06-11 LAB — HIV ANTIBODY (ROUTINE TESTING W REFLEX): HIV Screen 4th Generation wRfx: NONREACTIVE

## 2021-06-11 LAB — MRSA NEXT GEN BY PCR, NASAL: MRSA by PCR Next Gen: NOT DETECTED

## 2021-06-11 LAB — GLUCOSE, CAPILLARY
Glucose-Capillary: 158 mg/dL — ABNORMAL HIGH (ref 70–99)
Glucose-Capillary: 165 mg/dL — ABNORMAL HIGH (ref 70–99)
Glucose-Capillary: 172 mg/dL — ABNORMAL HIGH (ref 70–99)
Glucose-Capillary: 180 mg/dL — ABNORMAL HIGH (ref 70–99)
Glucose-Capillary: 252 mg/dL — ABNORMAL HIGH (ref 70–99)
Glucose-Capillary: 329 mg/dL — ABNORMAL HIGH (ref 70–99)

## 2021-06-11 LAB — CBG MONITORING, ED
Glucose-Capillary: >600 mg/dL (ref 70–99)
Glucose-Capillary: 372 mg/dL — ABNORMAL HIGH (ref 70–99)
Glucose-Capillary: 475 mg/dL — ABNORMAL HIGH (ref 70–99)

## 2021-06-11 LAB — MAGNESIUM: Magnesium: 2.3 mg/dL (ref 1.7–2.4)

## 2021-06-11 LAB — LACTIC ACID, PLASMA: Lactic Acid, Venous: 1.5 mmol/L (ref 0.5–1.9)

## 2021-06-11 LAB — LIPASE, BLOOD: Lipase: 38 U/L (ref 11–51)

## 2021-06-11 MED ORDER — POTASSIUM CHLORIDE 10 MEQ/100ML IV SOLN
10.0000 meq | INTRAVENOUS | Status: AC
  Administered 2021-06-11 (×2): 10 meq via INTRAVENOUS
  Filled 2021-06-11 (×2): qty 100

## 2021-06-11 MED ORDER — HYDRALAZINE HCL 20 MG/ML IJ SOLN: 10.0000 mg | Freq: Three times a day (TID) | INTRAMUSCULAR | Status: DC | PRN

## 2021-06-11 MED ORDER — SODIUM CHLORIDE 0.9 % IV BOLUS
1000.0000 mL | Freq: Once | INTRAVENOUS | Status: AC
  Administered 2021-06-11: 1000 mL via INTRAVENOUS

## 2021-06-11 MED ORDER — DEXTROSE 50 % IV SOLN: 0.0000 mL | INTRAVENOUS | Status: DC | PRN

## 2021-06-11 MED ORDER — POTASSIUM CHLORIDE 20 MEQ PO PACK
40.0000 meq | PACK | Freq: Once | ORAL | Status: AC
  Administered 2021-06-12: 40 meq via ORAL
  Filled 2021-06-11: qty 2

## 2021-06-11 MED ORDER — DEXTROSE IN LACTATED RINGERS 5 % IV SOLN: INTRAVENOUS | Status: AC

## 2021-06-11 MED ORDER — ATORVASTATIN CALCIUM 20 MG PO TABS
20.0000 mg | ORAL_TABLET | Freq: Every day | ORAL | Status: DC
Start: 2021-06-12 — End: 2021-06-13
  Administered 2021-06-12 – 2021-06-13 (×2): 20 mg via ORAL
  Filled 2021-06-11: qty 2
  Filled 2021-06-11: qty 1

## 2021-06-11 MED ORDER — INSULIN REGULAR(HUMAN) IN NACL 100-0.9 UT/100ML-% IV SOLN
INTRAVENOUS | Status: DC
  Filled 2021-06-11: qty 100

## 2021-06-11 MED ORDER — CHLORHEXIDINE GLUCONATE CLOTH 2 % EX PADS
6.0000 | MEDICATED_PAD | Freq: Every day | CUTANEOUS | Status: DC
  Administered 2021-06-11: 6 via TOPICAL

## 2021-06-11 MED ORDER — DEXTROSE IN LACTATED RINGERS 5 % IV SOLN: INTRAVENOUS | Status: DC

## 2021-06-11 MED ORDER — DARIFENACIN HYDROBROMIDE ER 7.5 MG PO TB24
7.5000 mg | ORAL_TABLET | Freq: Every day | ORAL | Status: DC
Start: 2021-06-11 — End: 2021-06-13
  Administered 2021-06-11 – 2021-06-13 (×3): 7.5 mg via ORAL
  Filled 2021-06-11 (×3): qty 1

## 2021-06-11 MED ORDER — INSULIN REGULAR(HUMAN) IN NACL 100-0.9 UT/100ML-% IV SOLN
INTRAVENOUS | Status: DC
  Administered 2021-06-11: 15 [IU]/h via INTRAVENOUS
  Filled 2021-06-11: qty 100

## 2021-06-11 MED ORDER — LACTATED RINGERS IV SOLN: INTRAVENOUS | Status: DC

## 2021-06-11 MED ORDER — MORPHINE SULFATE (PF) 2 MG/ML IV SOLN
2.0000 mg | INTRAVENOUS | Status: DC | PRN
  Administered 2021-06-12 (×2): 2 mg via INTRAVENOUS
  Filled 2021-06-11 (×2): qty 1

## 2021-06-11 MED ORDER — LACTATED RINGERS IV BOLUS
20.0000 mL/kg | Freq: Once | INTRAVENOUS | Status: AC
  Administered 2021-06-11: 1724 mL via INTRAVENOUS

## 2021-06-11 NOTE — ED Triage Notes (Signed)
Pt referred to ED from [facility] after presenting for 10 days of L flank pain. At [facility] pt was noted to be >600 glucose. During triage pt's CBG noted to read "HIGH". Pt denies hx of DM. Endorses increased thirst and urination.

## 2021-06-11 NOTE — Progress Notes (Signed)
Spoke with DM coordinator Crissie Figures RN - insulin gtt was not stopped - it had been paused x1 hour per endotool, then restarted.  Infusions verified to [facility] to reflect rate changes.  Will discuss need for long/short acting insulin with MD gtt is discontinued.

## 2021-06-11 NOTE — ED Provider Notes (Signed)
[facility] DEPT Provider Note   CSN: [id number] Arrival date & time: 06/11/21  1347     History  Chief Complaint  Patient presents with   Flank Pain   Hyperglycemia    Eric Leonard is a 61 y.o. male.  The history is provided by the patient and medical records. No language interpreter was used.  Flank Pain This is a new problem. The current episode started more than 1 week ago. The problem occurs constantly. Pertinent negatives include no chest pain, no abdominal pain, no headaches and no shortness of breath. Nothing aggravates the symptoms. Nothing relieves the symptoms. He has tried nothing for the symptoms. The treatment provided no relief.  Hyperglycemia Blood sugar level PTA:  >600 Severity:  Severe Onset quality:  Unable to specify Timing:  Constant Progression:  Unchanged Chronicity:  New Diabetes status:  Non-diabetic Relieved by:  Nothing Ineffective treatments:  None tried Associated symptoms: fatigue, increased thirst and polyuria   Associated symptoms: no abdominal pain, no chest pain, no confusion, no dysuria, no nausea, no shortness of breath, no vomiting and no weakness       Home Medications Prior to Admission medications   Medication Sig Start Date End Date Taking? Authorizing Provider  calcium-vitamin D (OSCAL WITH D) 500-200 MG-UNIT tablet Take 1 tablet by mouth daily.     [provider]  ciprofloxacin (CIPRO) 500 MG tablet Take 1 tablet (500 mg total) by mouth 2 (two) times daily. 05/13/17   Kathie Rhodes, MD  hydrochlorothiazide (HYDRODIURIL) 25 MG tablet Take 25 mg by mouth every evening.    [provider]  HYDROcodone-acetaminophen (NORCO) 10-325 MG tablet Take 1-2 tablets by mouth every 4 (four) hours as needed for moderate pain. Maximum dose per 24 hours - 8 pills 05/13/17   Kathie Rhodes, MD  Multiple Vitamin (MULTIVITAMIN) tablet Take 1 tablet by mouth daily.    [provider]       Allergies    Patient has no known allergies.    Review of Systems   Review of Systems  Constitutional:  Positive for fatigue and unexpected weight change. Negative for appetite change and chills.  HENT:  Negative for congestion.   Eyes:  Negative for visual disturbance.  Respiratory:  Negative for cough, chest tightness, shortness of breath and wheezing.   Cardiovascular:  Negative for chest pain, palpitations and leg swelling.  Gastrointestinal:  Negative for abdominal distention, abdominal pain, constipation, diarrhea, nausea and vomiting.  Endocrine: Positive for polydipsia and polyuria.  Genitourinary:  Positive for flank pain and frequency. Negative for dysuria.  Musculoskeletal:  Positive for back pain (l flank). Negative for neck pain and neck stiffness.  Skin:  Negative for rash and wound.  Neurological:  Negative for syncope, weakness, light-headedness, numbness and headaches.  Psychiatric/Behavioral:  Negative for agitation and confusion.   All other systems reviewed and are negative.  Physical Exam Updated Vital Signs BP (!) 132/98 (BP Location: Left Arm)   Pulse (!) 118   Temp 97.8 F (36.6 C) (Oral)   Resp 18   Ht '6\' 3"'$  (1.905 m)   Wt 86.2 kg   SpO2 98%   BMI 23.75 kg/m  Physical Exam Vitals and nursing note reviewed.  Constitutional:      General: He is not in acute distress.    Appearance: He is well-developed. He is not ill-appearing, toxic-appearing or diaphoretic.  HENT:     Head: Normocephalic and atraumatic.     Nose: No congestion  or rhinorrhea.     Mouth/Throat:     Mouth: Mucous membranes are dry.     Pharynx: No oropharyngeal exudate or posterior oropharyngeal erythema.  Eyes:     Extraocular Movements: Extraocular movements intact.     Conjunctiva/sclera: Conjunctivae normal.     Pupils: Pupils are equal, round, and reactive to light.  Cardiovascular:     Rate and Rhythm: Regular rhythm. Tachycardia present.     Heart sounds: No murmur  heard. Pulmonary:     Effort: Pulmonary effort is normal. No respiratory distress.     Breath sounds: Normal breath sounds.  Abdominal:     General: Abdomen is flat. There is no distension.     Palpations: Abdomen is soft.     Tenderness: There is no abdominal tenderness. There is left CVA tenderness. There is no right CVA tenderness, guarding or rebound.  Musculoskeletal:        General: Tenderness present. No swelling.     Cervical back: Neck supple. No tenderness.     Right lower leg: No edema.     Left lower leg: No edema.  Skin:    General: Skin is warm and dry.     Capillary Refill: Capillary refill takes less than 2 seconds.     Findings: No erythema or rash.  Neurological:     General: No focal deficit present.     Mental Status: He is alert.     Sensory: No sensory deficit.     Motor: No weakness.  Psychiatric:        Mood and Affect: Mood normal.    ED Results / Procedures / Treatments   Labs (all labs ordered are listed, but only abnormal results are displayed) Labs Reviewed  CBC WITH DIFFERENTIAL/PLATELET - Abnormal; Notable for the following components:      Result Value   MCHC 36.1 (*)    All other components within normal limits  COMPREHENSIVE METABOLIC PANEL - Abnormal; Notable for the following components:   CO2 21 (*)    Glucose, Bld 621 (*)    BUN 28 (*)    Creatinine, Ser 1.44 (*)    Total Bilirubin 1.5 (*)    GFR, Estimated 56 (*)    Anion gap 16 (*)    All other components within normal limits  BETA-HYDROXYBUTYRIC ACID - Abnormal; Notable for the following components:   Beta-Hydroxybutyric Acid 4.13 (*)    All other components within normal limits  BLOOD GAS, VENOUS - Abnormal; Notable for the following components:   pCO2, Ven 36 (*)    pO2, Ven 72 (*)    All other components within normal limits  URINALYSIS, ROUTINE W REFLEX MICROSCOPIC - Abnormal; Notable for the following components:   Color, Urine COLORLESS (*)    APPearance HAZY (*)     Glucose, UA >=500 (*)    Ketones, ur 20 (*)    Leukocytes,Ua MODERATE (*)    WBC, UA >50 (*)    All other components within normal limits  CBG MONITORING, ED - Abnormal; Notable for the following components:   Glucose-Capillary >600 (*)    All other components within normal limits  URINE CULTURE  LIPASE, BLOOD  LACTIC ACID, PLASMA  TSH  LACTIC ACID, PLASMA    EKG EKG Interpretation  Date/Time:  Sunday Jun 11 2021 15:09:48 EDT Ventricular Rate:  97 PR Interval:  141 QRS Duration: 78 QT Interval:  356 QTC Calculation: 453 R Axis:   68 Text Interpretation: Sinus rhythm  when compared to prior, faster rate. No STEMI Confirmed by Antony Blackbird [phone]) on 06/11/2021 3:23:26 PM  Radiology CT Renal Stone Study  Result Date: 06/11/2021 CLINICAL DATA:  Flank pain, kidney stone suspected. Left costovertebral angle and flank pain. EXAM: CT ABDOMEN AND PELVIS WITHOUT CONTRAST TECHNIQUE: Multidetector CT imaging of the abdomen and pelvis was performed following the standard protocol without IV contrast. RADIATION DOSE REDUCTION: This exam was performed according to the departmental dose-optimization program which includes automated exposure control, adjustment of the mA and/or kV according to patient size and/or use of iterative reconstruction technique. COMPARISON:  None Available. FINDINGS: Lower chest: No acute abnormality. Hepatobiliary: No focal liver abnormality is seen. No gallstones, gallbladder wall thickening, or biliary dilatation. Pancreas: Unremarkable. No pancreatic ductal dilatation or surrounding inflammatory changes. Spleen: Normal in size without focal abnormality. Adrenals/Urinary Tract: Adrenal glands are unremarkable. Kidneys are normal, without renal calculi, focal lesion, or hydronephrosis. Mildly dilated left ureter without evidence of ureteral calculus or evidence of fatty fat stranding, likely a chronic process. Bladder is unremarkable. Stomach/Bowel: Stomach is within normal  limits. Appendix appears normal. No evidence of bowel wall thickening, distention, or inflammatory changes. Vascular/Lymphatic: No significant vascular findings are present. No enlarged abdominal or pelvic lymph nodes. Reproductive: Multiple brachytherapy seeds in the prostate. Other: Fat containing small left inguinal hernia. There is also fat containing umbilical hernia. No evidence of obstruction. No ascites Musculoskeletal: No acute or significant osseous findings. IMPRESSION: 1. No evidence of nephrolithiasis or hydronephrosis. No ureteral calculus. No evidence of recently passed calculus. 2. Bowel loops are normal in caliber. Normal appendix. No evidence of colitis or diverticulitis. 3.  Multiple brachytherapy seeds in the prostate. 4.  No CT evidence of acute abdominal/pelvic process. Electronically Signed   By: Keane Police D.O.   On: 06/11/2021 15:02    Procedures Procedures    CRITICAL CARE Performed by: Gwenyth Allegra Tiffney Haughton Total critical care time: 40 minutes Critical care time was exclusive of separately billable procedures and treating other patients. Critical care was necessary to treat or prevent imminent or life-threatening deterioration. Critical care was time spent personally by me on the following activities: development of treatment plan with patient and/or surrogate as well as nursing, discussions with consultants, evaluation of patient's response to treatment, examination of patient, obtaining history from patient or surrogate, ordering and performing treatments and interventions, ordering and review of laboratory studies, ordering and review of radiographic studies, pulse oximetry and re-evaluation of patient's condition.   Medications Ordered in ED Medications  insulin regular, human (MYXREDLIN) 100 units/ 100 mL infusion (has no administration in time range)  lactated ringers infusion (has no administration in time range)  dextrose 5 % in lactated ringers infusion (has no  administration in time range)  dextrose 50 % solution 0-50 mL (has no administration in time range)  sodium chloride 0.9 % bolus 1,000 mL (0 mLs Intravenous Stopped 06/11/21 1551)    ED Course/ Medical Decision Making/ A&P                           Medical Decision Making Problems Addressed: Hyperglycemia: acute illness or injury that poses a threat to life or bodily functions  Amount and/or Complexity of Data Reviewed External Data Reviewed: labs. Labs: ordered. Radiology: ordered. ECG/medicine tests: ordered.  Risk Prescription drug management. Decision regarding hospitalization.    Eric Leonard is a 61 y.o. male with a past medical history significant for prostate cancer status post  radiation seeds, hypertension, and GERD who presents with hyperglycemia, fatigue, left flank/CVA pain, polyuria, and polydipsia.  According to patient, for the last few weeks he has been having polyuria and polydipsia and thinks that the frequency.  He denies any hematuria or dysuria.  He reports that over the last few days he has had pain in his left flank going towards his left CVA area that is new.  He denies history of kidney stones but does feel very dehydrated.  He also reports has been losing a significant amount of weight.  He denies any anterior abdominal pain and denies any trauma.  Denies any groin symptoms.  Denies any fevers, chills, Jassen, cough, chest pain, shortness of breath, or palpitations.  Does report feeling just more fatigued and tired.  Patient went to urgent care where he was found to have a CBG greater than 600 and was told to come to emergency department.  He denies any personal history of diabetes or any family history of this.  On exam, lungs clear and chest nontender.  Abdomen was nontender on the anterior side and had normal bowel sounds.  Patient's left flank and left CVA area was quite tender.  No rash to suggest shingles.  No focal neurologic deficits.  Mucous membranes  are very dry.  Pupils are symmetric and reactive to Movements.  Clinically I do SPECT patient has presentation new onset diabetes.  With his sharp and intermittently severe left flank pain I am concerned about possible kidney stone forming in the setting of dehydration from his diabetes.  He is not having significant nausea, vomiting, or syncope so have less admission for DKA however, we will get work-up to look for DKA as well.  We will give some fluids initially until labs returned.  We will get urinalysis given the flank pain and a stone study.  Anticipate admission for hyperglycemia when work-up is completed.  3:53 PM CT scan does not show evidence of acute abnormality in his abdomen pelvis.  Specifically, no stones.  Labs began to return and patient is very hyperglycemic with a glucose of 621.  Patient also has a slightly low CO2 of 21 and anion gap of 16.  He does have some ketones in his urine however he is not acidotic with a pH of 7.4.  Do not feel he is completely in DKA but I am concerned he is heading in that direction.  His CBC is reassuring and his lactic acid is normal.  His beta hydroxy uric acid is elevated also concerning for heading towards DKA.  TSH is normal.  Lipase normal.  We will order insulin drip with the hyperglycemia protocol that does not appear to be DKA or HHS and he will be admitted for new onset diabetes with severe hyperglycemia and other lab abnormalities.  Patient will be admitted for further management.        Final Clinical Impression(s) / ED Diagnoses Final diagnoses:  Hyperglycemia  Left flank pain     Clinical Impression: 1. Hyperglycemia   2. Left flank pain     Disposition: Admit  This note was prepared with assistance of Dragon voice recognition software. Occasional wrong-word or sound-a-like substitutions may have occurred due to the inherent limitations of voice recognition software.     Imanii Gosdin, Gwenyth Allegra, MD 06/11/21 [phone]

## 2021-06-11 NOTE — H&P (Signed)
History and Physical    Patient: Eric Leonard XBM:[id number] DOB: [birth year] DOA: 06/11/2021 DOS: the patient was seen and examined on 06/11/2021 PCP: Pcp, No  Patient coming from: Home  Chief Complaint:  Chief Complaint  Patient presents with   Flank Pain   Hyperglycemia   HPI: Eric Leonard is a 61 y.o. male with medical history significant of HTN, HLD, prostate CA. Presenting with flank pain and hyperglycemia. He reports increased LLQ abdominal and left flank pain over the last week. He tried his home medications, but they were not helpful. He tried to get in with urology 3 days ago, but was unable to do so. He went to urgent care to get relief today. After they drew labs, he was directed to the ED d/t his glucose being high. He reports that he's had several weeks of increased thirst, dry mouth, and polyuria. However, he attributed it to medication. He denies any other aggravating or alleviating factors.  Review of Systems: As mentioned in the history of present illness. All other systems reviewed and are negative. Past Medical History:  Diagnosis Date   Family history of breast cancer    Family history of pancreatic cancer    Family history of prostate cancer    Family history of stomach cancer    GERD (gastroesophageal reflux disease)    OCCASIONAL   Hyperplasia of prostate with lower urinary tract symptoms (LUTS)    Hypertension    Prostate cancer [facility]) urologist-  dr ottelin/  oncologist-  dr Tammi Klippel   dx 01-17-2017--  T1c, Gleason 3+4, PSA 5.13, vol 58cc--- scheduled for radioactive seed implants 05-13-2017   Past Surgical History:  Procedure Laterality Date   COLONOSCOPY WITH PROPOFOL  2018   DENTAL SURGERY     PROSTATE BIOPSY  01-17-2017   dr Karsten [facility] office   RADIOACTIVE SEED IMPLANT N/A 05/13/2017   Procedure: RADIOACTIVE SEED IMPLANT/BRACHYTHERAPY IMPLANT;  Surgeon: Kathie Rhodes, MD;  Location: [facility];  Service: Urology;  Laterality: N/A;   SPACE OAR  INSTILLATION N/A 05/13/2017   Procedure: SPACE OAR INSTILLATION;  Surgeon: Kathie Rhodes, MD;  Location: [facility];  Service: Urology;  Laterality: N/A;   Social History:  reports that he has never smoked. He has never used smokeless tobacco. He reports current alcohol use. He reports that he does not use drugs.  No Known Allergies  Family History  Problem Relation Age of Onset   Breast cancer Mother        dx in her 57s   Stomach cancer Father        d. 56   Prostate cancer Paternal Uncle    Breast cancer Paternal Grandmother    Pancreatic cancer Paternal Aunt    Lung cancer Maternal Aunt     Prior to Admission medications   Medication Sig Start Date End Date Taking? Authorizing Provider  acetaminophen (TYLENOL) 500 MG tablet Take 1,000 mg by mouth every 6 (six) hours as needed for moderate pain.   Yes [provider]  atorvastatin (LIPITOR) 20 MG tablet Take 20 mg by mouth daily. 04/30/21  Yes [provider]  cholecalciferol (VITAMIN D3) 25 MCG (1000 UNIT) tablet Take 1,000 Units by mouth daily.   Yes [provider]  hydrochlorothiazide (HYDRODIURIL) 25 MG tablet Take 25 mg by mouth every evening.   Yes [provider]  Multiple Vitamin (MULTIVITAMIN) tablet Take 1 tablet by mouth daily.   Yes [provider]  solifenacin (VESICARE) 5 MG tablet  Take 5 mg by mouth daily. 04/20/21  Yes [provider]  ciprofloxacin (CIPRO) 500 MG tablet Take 1 tablet (500 mg total) by mouth 2 (two) times daily. Patient not taking: Reported on 06/11/2021 05/13/17   Kathie Rhodes, MD  HYDROcodone-acetaminophen [facility]) 10-325 MG tablet Take 1-2 tablets by mouth every 4 (four) hours as needed for moderate pain. Maximum dose per 24 hours - 8 pills Patient not taking: Reported on 06/11/2021 05/13/17   Kathie Rhodes, MD    Physical Exam: Vitals:   06/11/21 1445 06/11/21 1540 06/11/21 1600 06/11/21 1700  BP: (!) 133/93 139/89 132/88 (!)  149/92  Pulse: 98 87 91 91  Resp: '16 15 18 16  '$ Temp:      TempSrc:      SpO2: 98% 99% 99% 98%  Weight:      Height:       General: 61 y.o. male resting in bed in NAD Eyes: PERRL, normal sclera ENMT: Nares patent w/o discharge, orophaynx clear, dentition normal, ears w/o discharge/lesions/ulcers Neck: Supple, trachea midline Cardiovascular: RRR, +S1, S2, no m/g/r, equal pulses throughout Respiratory: CTABL, no w/r/r, normal WOB GI: BS+, NDNT, no masses noted, no organomegaly noted MSK: No e/c/c Neuro: A&O x 3, no focal deficits Psyc: Appropriate interaction and affect, calm/cooperative  Data Reviewed:  Na+  134 CO2 21 Glucose  621 BUN  28 SCR  1.44 Anion cap  16 T bili 1.5 Lactic acid  1.5 WBC  4.8 Hgb  16.3  CT renal stone study 1. No evidence of nephrolithiasis or hydronephrosis. No ureteral calculus. No evidence of recently passed calculus. 2. Bowel loops are normal in caliber. Normal appendix. No evidence of colitis or diverticulitis. 3.  Multiple brachytherapy seeds in the prostate. 4.  No CT evidence of acute abdominal/pelvic process.  Assessment and Plan: Early DKA New onset DM2    - admit to inpt, SDU    - Endotool: insulin gtt, fluids    - check A1c, follow BMP, beta-hydroxybutyric acid    - DM coordinator consult    - NPO except for non-caloric fluids, sips w/ meds  HTN    - hold home regimen d/t AKI    - PRN available  AKI    - no obstruction seen in imaging    - fluids, watch nephrotoxins  Prostate CA     - continue outpt follow up  Abdominal pain     - imaging negative     - exam benign     - PRN pain control  HLD     - resume home regimen  Advance Care Planning:   Code Status: Full Code  Consults: None  Family Communication: None at bedside  Severity of Illness: The appropriate patient status for this patient is INPATIENT. Inpatient status is judged to be reasonable and necessary in order to provide the required intensity of  service to ensure the patient's safety. The patient's presenting symptoms, physical exam findings, and initial radiographic and laboratory data in the context of their chronic comorbidities is felt to place them at high risk for further clinical deterioration. Furthermore, it is not anticipated that the patient will be medically stable for discharge from the hospital within 2 midnights of admission.   * I certify that at the point of admission it is my clinical judgment that the patient will require inpatient hospital care spanning beyond 2 midnights from the point of admission due to high intensity of service, high risk for further deterioration and high  frequency of surveillance required.*  Author: Jonnie Finner, DO 06/11/2021 5:34 PM  For on call review [email or web address].

## 2021-06-11 NOTE — Progress Notes (Addendum)
Inpatient Diabetes Program Recommendations  AACE/ADA: New Consensus Statement on Inpatient Glycemic Control (2015)  Target Ranges:  Prepandial:   less than 140 mg/dL      Peak postprandial:   less than 180 mg/dL (1-2 hours)      Critically ill patients:  140 - 180 mg/dL    Latest Reference Range & Units 06/11/21 14:15  Sodium 135 - 145 mmol/L 136  Potassium 3.5 - 5.1 mmol/L 4.3  Chloride 98 - 111 mmol/L 99  CO2 22 - 32 mmol/L 21 (L)  Glucose 70 - 99 mg/dL 621 (HH)  BUN 6 - 20 mg/dL 28 (H)  Creatinine 0.61 - 1.24 mg/dL 1.44 (H)  Calcium 8.9 - 10.3 mg/dL 10.0  Anion gap 5 - 15  16 (H)    Latest Reference Range & Units 06/11/21 14:15  Beta-Hydroxybutyric Acid 0.05 - 0.27 mmol/L 4.13 (H)    Latest Reference Range & Units 06/11/21 13:58 06/11/21 16:35 06/11/21 17:14 06/11/21 18:12 06/11/21 19:24 06/11/21 20:29  Glucose-Capillary 70 - 99 mg/dL >600 (HH) 475 (H)  IV Insulin Drip Started 372 (H) 329 (H) 158 (H  IV Insulin Drip Stopped 165 (H)     Admit with:  Flank Pain Hyperglycemia New onset Diabetes Acute Kidney Injury  History: Prostate Cancer  Current Orders: IV Insulin Drip    No PCP listed  Looks like IV Insulin Drip was stopped at 7:30pm--No SQ Insulin given prior to d/c of the Drip  Current A1c Pending   MD- Note CBG down to 165 tonight after IVF and short-term IV Insulin Drip  Please consider adding Novolog 0-9 units SSI Q4 hours  Sent [place] to RN asking them to address insulin orders with Attending MD  Addendum: Damaris Schooner w/ RN via [place].  IV Insulin Drip was only suspended for 1 hour (per Endotool instructions) and was resumed around 8pm.  Will follow.    --Will follow patient during hospitalization--  Wyn Quaker RN, MSN, CDE Diabetes Coordinator Inpatient Glycemic Control Team Team Pager: [phone] (8a-5p)

## 2021-06-12 DIAGNOSIS — I10 Essential (primary) hypertension: Secondary | ICD-10-CM | POA: Diagnosis not present

## 2021-06-12 DIAGNOSIS — E1110 Type 2 diabetes mellitus with ketoacidosis without coma: Secondary | ICD-10-CM | POA: Diagnosis not present

## 2021-06-12 DIAGNOSIS — E119 Type 2 diabetes mellitus without complications: Secondary | ICD-10-CM

## 2021-06-12 LAB — BASIC METABOLIC PANEL
Anion gap: 5 (ref 5–15)
Anion gap: 4 — ABNORMAL LOW (ref 5–15)
BUN: 16 mg/dL (ref 6–20)
BUN: 16 mg/dL (ref 6–20)
CO2: 27 mmol/L (ref 22–32)
CO2: 28 mmol/L (ref 22–32)
Calcium: 8.8 mg/dL — ABNORMAL LOW (ref 8.9–10.3)
Calcium: 9.1 mg/dL (ref 8.9–10.3)
Chloride: 112 mmol/L — ABNORMAL HIGH (ref 98–111)
Chloride: 114 mmol/L — ABNORMAL HIGH (ref 98–111)
Creatinine, Ser: 0.98 mg/dL (ref 0.61–1.24)
Creatinine, Ser: 1.05 mg/dL (ref 0.61–1.24)
GFR, Estimated: >60 mL/min (ref >60)
GFR, Estimated: >60 mL/min (ref >60)
Glucose, Bld: 156 mg/dL — ABNORMAL HIGH (ref 70–99)
Glucose, Bld: 59 mg/dL — ABNORMAL LOW (ref 70–99)
Potassium: 3.4 mmol/L — ABNORMAL LOW (ref 3.5–5.1)
Potassium: 3.6 mmol/L (ref 3.5–5.1)
Sodium: 144 mmol/L (ref 135–145)
Sodium: 146 mmol/L — ABNORMAL HIGH (ref 135–145)

## 2021-06-12 LAB — GLUCOSE, CAPILLARY
Glucose-Capillary: 155 mg/dL — ABNORMAL HIGH (ref 70–99)
Glucose-Capillary: 160 mg/dL — ABNORMAL HIGH (ref 70–99)
Glucose-Capillary: 162 mg/dL — ABNORMAL HIGH (ref 70–99)
Glucose-Capillary: 205 mg/dL — ABNORMAL HIGH (ref 70–99)
Glucose-Capillary: 209 mg/dL — ABNORMAL HIGH (ref 70–99)
Glucose-Capillary: 221 mg/dL — ABNORMAL HIGH (ref 70–99)
Glucose-Capillary: 232 mg/dL — ABNORMAL HIGH (ref 70–99)
Glucose-Capillary: 241 mg/dL — ABNORMAL HIGH (ref 70–99)
Glucose-Capillary: 262 mg/dL — ABNORMAL HIGH (ref 70–99)
Glucose-Capillary: 278 mg/dL — ABNORMAL HIGH (ref 70–99)
Glucose-Capillary: 292 mg/dL — ABNORMAL HIGH (ref 70–99)
Glucose-Capillary: 347 mg/dL — ABNORMAL HIGH (ref 70–99)
Glucose-Capillary: 85 mg/dL (ref 70–99)
Glucose-Capillary: 93 mg/dL (ref 70–99)

## 2021-06-12 LAB — URINE CULTURE: Culture: >=100000 — AB

## 2021-06-12 LAB — BETA-HYDROXYBUTYRIC ACID: Beta-Hydroxybutyric Acid: 0.39 mmol/L — ABNORMAL HIGH (ref 0.05–0.27)

## 2021-06-12 MED ORDER — INSULIN STARTER KIT- PEN NEEDLES (ENGLISH)
1.0000 | Freq: Once | Status: DC
Start: 2021-06-12 — End: 2021-06-13
  Filled 2021-06-12: qty 1

## 2021-06-12 MED ORDER — LIVING WELL WITH DIABETES BOOK
Freq: Once | Status: AC
  Filled 2021-06-12: qty 1

## 2021-06-12 MED ORDER — SODIUM CHLORIDE 0.45 % IV SOLN: INTRAVENOUS | Status: DC

## 2021-06-12 MED ORDER — INSULIN ASPART 100 UNIT/ML IJ SOLN
0.0000 [IU] | Freq: Every day | INTRAMUSCULAR | Status: DC
  Administered 2021-06-12: 4 [IU] via SUBCUTANEOUS

## 2021-06-12 MED ORDER — INSULIN GLARGINE-YFGN 100 UNIT/ML ~~LOC~~ SOLN
8.0000 [IU] | Freq: Every day | SUBCUTANEOUS | Status: DC
  Administered 2021-06-12: 8 [IU] via SUBCUTANEOUS
  Filled 2021-06-12 (×2): qty 0.08

## 2021-06-12 MED ORDER — INSULIN ASPART 100 UNIT/ML IJ SOLN
0.0000 [IU] | Freq: Three times a day (TID) | INTRAMUSCULAR | Status: DC
  Administered 2021-06-12: 5 [IU] via SUBCUTANEOUS
  Administered 2021-06-12: 8 [IU] via SUBCUTANEOUS
  Administered 2021-06-13: 11 [IU] via SUBCUTANEOUS

## 2021-06-12 NOTE — Progress Notes (Signed)
Transition of Care [facility]) Screening Note  Patient Details  Name: Kaydan Wong Date of Birth: August 25, 1960  Transition of Care [facility]) CM/SW Contact:    Sherie Don, LCSW Phone Number: 06/12/2021, 9:08 AM  Transition of Care Department [facility]) has reviewed patient and no TOC needs have been identified at this time. We will continue to monitor patient advancement through interdisciplinary progression rounds. If new patient transition needs arise, please place a TOC consult.

## 2021-06-12 NOTE — Progress Notes (Signed)
[facility] PROGRESS NOTE   Eric Leonard CVE:[id number] DOB: [birth year] DOA: 06/11/2021  PCP: Pcp, No  Brief History/Interval Summary: 61 y.o. male with medical history significant of HTN, HLD, prostate CA. presented with left-sided flank pain and was noted to be hyperglycemic.  He was hospitalized for new onset diabetes.  Anion gap was mildly elevated.    Consultants: None  Procedures: None    Subjective/Interval History: Patient mentions that he is feeling better this morning.  Does not have any further flank or abdominal pain.  No nausea or vomiting.  Surprised by his new diagnosis of diabetes.    Assessment/Plan:  New onset diabetes mellitus type 2/early diabetic ketoacidosis Patient was hospitalized and placed on IV insulin.  His blood work has improved.  Anion gap is closed.  Did have episodes of hypoglycemia overnight.  His insulin infusion was discontinued.  Now again hyperglycemic.   We will transition him to subcutaneous insulin with glargine today.  SSI. HbA1c still pending.  Beta-hydroxybutyrate acid levels have improved. Diabetes education.  He will need education to self administer insulin. Initiate diet.  Flank pain CT scan did not show any acute findings.  Symptoms appear to have resolved.  Could have been due to his hyperglycemia and early DKA.  Essential hypertension Blood pressure reasonably well controlled.  HCTZ is currently on hold.  Acute kidney injury Likely due to hypovolemia.  Now back to baseline.  History of prostate cancer Stable.  Follows with urology.  Abnormal UA noted.  Patient denies any dysuria.  He did have polyuria which could have been due to hyperglycemia.  Hold off on antibiotics.  Hyperlipidemia Continue statin.    DVT Prophylaxis: SCDs Code Status: Full code Family Communication: Discussed with patient Disposition Plan: Transfer to [facility] floor.  Hopefully home in the next 24 hours  Status is: Inpatient Remains  inpatient appropriate because: New onset diabetes mellitus      Medications: Scheduled:  atorvastatin  20 mg Oral Daily   Chlorhexidine Gluconate Cloth  6 each Topical Daily   darifenacin  7.5 mg Oral Daily   insulin aspart  0-15 Units Subcutaneous TID WC   insulin aspart  0-5 Units Subcutaneous QHS   insulin glargine-yfgn  8 Units Subcutaneous Daily   living well with diabetes book   Does not apply Once   Continuous:  sodium chloride     dextrose 5% lactated ringers 125 mL/hr at 06/12/21 0800   insulin 1.9 Units/hr (06/12/21 0800)   WCH:ENIDPOEU, hydrALAZINE, morphine injection  Antibiotics: Anti-infectives (From admission, onward)    None       Objective:  Vital Signs  Vitals:   06/12/21 0400 06/12/21 0600 06/12/21 0700 06/12/21 0800  BP: 119/78 137/90  138/86  Pulse: 78 90  88  Resp: '16 14  13  '$ Temp: 98.3 F (36.8 C)  98 F (36.7 C)   TempSrc: Oral  Oral   SpO2: 98% 98%  97%  Weight:      Height:        Intake/Output Summary (Last 24 hours) at 06/12/2021 0846 Last data filed at 06/12/2021 0800 Gross per 24 hour  Intake 4903 ml  Output 700 ml  Net 4203 ml   Filed Weights   06/11/21 1401  Weight: 86.2 kg    General appearance: Awake alert.  In no distress Resp: Clear to auscultation bilaterally.  Normal effort Cardio: S1-S2 is normal regular.  No S3-S4.  No rubs murmurs or bruit GI: Abdomen is soft.  Nontender nondistended.  Bowel sounds are present normal.  No masses organomegaly Extremities: No edema.  Full range of motion of lower extremities. Neurologic: Alert and oriented x3.  No focal neurological deficits.    Lab Results:  Data Reviewed: I have personally reviewed following labs and reports of the imaging studies  CBC: Recent Labs  Lab 06/11/21 1415  WBC 4.8  NEUTROABS 2.6  HGB 16.3  HCT 45.2  MCV 89.9  PLT 829    Basic Metabolic Panel: Recent Labs  Lab 06/11/21 1730 06/11/21 1943 06/11/21 2200 06/12/21 0047  06/12/21 0422  NA 143 145 142 144 146*  K 4.4 3.6 3.2* 3.4* 3.6  CL 106 113* 110 112* 114*  CO2 '22 26 27 27 28  '$ GLUCOSE 417* 142* 201* 156* 59*  BUN 22* '19 18 16 16  '$ CREATININE 1.12 0.90 0.99 0.98 1.05  CALCIUM 9.2 9.1 8.4* 8.8* 9.1  MG 2.3  --   --   --   --     GFR: Estimated Creatinine Clearance: 91.7 mL/min (by C-G formula based on SCr of 1.05 mg/dL).  Liver Function Tests: Recent Labs  Lab 06/11/21 1415  AST 21  ALT 31  ALKPHOS 103  BILITOT 1.5*  PROT 7.7  ALBUMIN 3.8    Recent Labs  Lab 06/11/21 1415  LIPASE 38     HbA1C: No results for input(s): HGBA1C in the last 72 hours.  CBG: Recent Labs  Lab 06/12/21 0449 06/12/21 0553 06/12/21 0651 06/12/21 0740 06/12/21 0810  GLUCAP 93 160* 262* 205* 209*      Thyroid Function Tests: Recent Labs    06/11/21 1417  TSH 1.781      Recent Results (from the past 240 hour(s))  MRSA Next Gen by PCR, Nasal     Status: None   Collection Time: 06/11/21 10:11 PM   Specimen: Nasal Mucosa; Nasal Swab  Result Value Ref Range Status   MRSA by PCR Next Gen NOT DETECTED NOT DETECTED Final    Comment: (NOTE) The GeneXpert MRSA Assay (FDA approved for NASAL specimens only), is one component of a comprehensive MRSA colonization surveillance program. It is not intended to diagnose MRSA infection nor to guide or monitor treatment for MRSA infections. Test performance is not FDA approved in patients less than 38 years old. Performed at [facility], [place] [address], [place], [place] [address]       Radiology Studies: CT Renal Stone Study  Result Date: 06/11/2021 CLINICAL DATA:  Flank pain, kidney stone suspected. Left costovertebral angle and flank pain. EXAM: CT ABDOMEN AND PELVIS WITHOUT CONTRAST TECHNIQUE: Multidetector CT imaging of the abdomen and pelvis was performed following the standard protocol without IV contrast. RADIATION DOSE REDUCTION: This exam was performed according to the  departmental dose-optimization program which includes automated exposure control, adjustment of the mA and/or kV according to patient size and/or use of iterative reconstruction technique. COMPARISON:  None Available. FINDINGS: Lower chest: No acute abnormality. Hepatobiliary: No focal liver abnormality is seen. No gallstones, gallbladder wall thickening, or biliary dilatation. Pancreas: Unremarkable. No pancreatic ductal dilatation or surrounding inflammatory changes. Spleen: Normal in size without focal abnormality. Adrenals/Urinary Tract: Adrenal glands are unremarkable. Kidneys are normal, without renal calculi, focal lesion, or hydronephrosis. Mildly dilated left ureter without evidence of ureteral calculus or evidence of fatty fat stranding, likely a chronic process. Bladder is unremarkable. Stomach/Bowel: Stomach is within normal limits. Appendix appears normal. No evidence of bowel wall thickening, distention, or inflammatory changes. Vascular/Lymphatic:  No significant vascular findings are present. No enlarged abdominal or pelvic lymph nodes. Reproductive: Multiple brachytherapy seeds in the prostate. Other: Fat containing small left inguinal hernia. There is also fat containing umbilical hernia. No evidence of obstruction. No ascites Musculoskeletal: No acute or significant osseous findings. IMPRESSION: 1. No evidence of nephrolithiasis or hydronephrosis. No ureteral calculus. No evidence of recently passed calculus. 2. Bowel loops are normal in caliber. Normal appendix. No evidence of colitis or diverticulitis. 3.  Multiple brachytherapy seeds in the prostate. 4.  No CT evidence of acute abdominal/pelvic process. Electronically Signed   By: Keane Police D.O.   On: 06/11/2021 15:02       LOS: 1 day   [facility] Pager on [email or web address]  06/12/2021, 8:46 AM

## 2021-06-12 NOTE — Progress Notes (Addendum)
Inpatient Diabetes Program Recommendations  AACE/ADA: New Consensus Statement on Inpatient Glycemic Control (2015)  Target Ranges:  Prepandial:   less than 140 mg/dL      Peak postprandial:   less than 180 mg/dL (1-2 hours)      Critically ill patients:  140 - 180 mg/dL    Latest Reference Range & Units 06/11/21 22:37 06/11/21 23:47 06/12/21 00:46 06/12/21 01:50 06/12/21 02:57 06/12/21 04:12 06/12/21 04:49 06/12/21 05:53 06/12/21 06:51  Glucose-Capillary 70 - 99 mg/dL 172 (H) 180 (H) 155 (H) 162 (H) 292 (H) 85 93 160 (H) 262 (H)  (H): Data is abnormally high   Admit with:  Flank Pain Hyperglycemia New onset Diabetes Acute Kidney Injury   History: Prostate Cancer   Current Orders: IV Insulin Drip       No PCP listed   IV Insulin Drip was only suspended for about 1 hour yesterday per Endotool instructions--Drip continued through the evening and night   Current A1c Pending  MD- Note 4:22am BMET shows the following:  Glucose 59 Anion Gap 4 CO2 level 28  When you allow pt to transition to SQ Insulin, please consider:  1. Start Semglee 8 units Daily (0.1 units/kg) Make sure to continue IV Insulin Drip for 2 hours after Semglee given  2. Start Novolog Sensitive Correction Scale/ SSI (0-9 units) TID AC + HS  Looks like pt's insurance covers/prefers Levemir and Humalog: Levemir Insulin Pen- Order Number [id number] Humalog Insulin Pen- Order Number [id number] Insulin Pen Needles- Order Number [id number] Freestyle Libre 2 Sensors- Order Number [id number]    Addendum 9am--Met w/ pt at bedside to discuss new diabetes diagnosis.  Pt told me his Mom and sister both have diabetes.  Began having symptoms of Hyperglycemia (thirst and urination) about 1 week prior to admit.  No previous issues with glucose.  Told me Dr. Maryland Pink plans to send him home on insulin and is agreeable to this plan.  Asked me about getting Rx for Freestyle Libre 2 CGM and what cost would be.  Discussed with pt that I am  unsure what his co-pay would be (pt states he has high deductible plan) but relayed to pt that 2 sensors filled at pharmacy with his insurance  max cost would be $75.  I tried to help pt download the [employer] 2 app to his phone, however, pt has an Android phone and his phone does not support this app.  He will need the libre reader to scan for CBGs--Pt OK with this and would like to proceed.  Sent Dr. Maryland Pink a [place] 2--MD have me permission to start pt on a sensor and will send home on Levemir and Humalog as pt's insurance prefers these insulins.  Spoke with pt about new diagnosis.  Discussed A1C results are pending--Explained what an A1C is, basic pathophysiology of DM Type 2, basic home care, basic diabetes diet nutrition principles, importance of checking CBGs and maintaining good CBG control to prevent long-term and short-term complications.  Reviewed signs and symptoms of hyperglycemia and hypoglycemia and how to treat hypoglycemia at home.  Also reviewed blood sugar goals and A1c goals for home.    RNs to provide ongoing basic DM education at bedside with this patient.  Have ordered educational booklet, insulin starter kit.  Have also placed RD consult for DM diet education for this patient.  Educated patient on insulin pen use at home.  Reviewed all steps of insulin pen including attachment of needle, 2-unit air  shot, dialing up dose, giving injection, rotation of injection sites, removing needle, disposal of sharps, storage of unused insulin, disposal of insulin etc.  Patient able to provide successful return demonstration.  Reviewed troubleshooting with insulin pen.     Addendum 11am--Took pt a [employer] 2 reader, sensor, and extra sensor with permission given by Dr. Maryland Pink.  Reviewed Freestyle Libre 2 CGM system (how to use, how to place new sensor, sensor life, troubleshooting, warm-up time, how to use reader, rotation of insertion sites, etc).  Assisted pt and  to assemble and place Libre sensor on L Upper arm.  Pt knows he will able to scan for CBG 1 hr after sensor placed.  Pt instructed to replace sensor in 14 days.  Encouraged pt to scan for CBGs pre and post meals.  Reviewed healthy CBG goals for home.  Pt asked to seek refills for the sensors from his PCP.  Pt educated to check fingerstick CBG with traditional CBG meter when glucose reading does not match how he feels.  RN made aware that CGM applied.  Pt educated that RN will still need to check fingerstick CBGs with hospital meter despite CGM applied--Pt agreeable.       --Will follow patient during hospitalization--  Wyn Quaker RN, MSN, CDE Diabetes Coordinator Inpatient Glycemic Control Team Team Pager: [phone] (8a-5p)

## 2021-06-12 NOTE — Plan of Care (Signed)
  Problem: Coping: Goal: Ability to adjust to condition or change in health will improve Outcome: Progressing   Problem: Pain Managment: Goal: General experience of comfort will improve Outcome: Progressing   

## 2021-06-13 ENCOUNTER — Encounter (HOSPITAL_COMMUNITY): Payer: Self-pay | Admitting: Internal Medicine

## 2021-06-13 DIAGNOSIS — E119 Type 2 diabetes mellitus without complications: Secondary | ICD-10-CM | POA: Diagnosis not present

## 2021-06-13 LAB — CBC
HCT: 39.5 % (ref 39.0–52.0)
Hemoglobin: 13.4 g/dL (ref 13.0–17.0)
MCH: 31.7 pg (ref 26.0–34.0)
MCHC: 33.9 g/dL (ref 30.0–36.0)
MCV: 93.4 fL (ref 80.0–100.0)
Platelets: 216 10*3/uL (ref 150–400)
RBC: 4.23 MIL/uL (ref 4.22–5.81)
RDW: 12.7 % (ref 11.5–15.5)
WBC: 4 10*3/uL (ref 4.0–10.5)
nRBC: 0 % (ref 0.0–0.2)

## 2021-06-13 LAB — BASIC METABOLIC PANEL
Anion gap: 10 (ref 5–15)
BUN: 12 mg/dL (ref 6–20)
CO2: 22 mmol/L (ref 22–32)
Calcium: 8.2 mg/dL — ABNORMAL LOW (ref 8.9–10.3)
Chloride: 106 mmol/L (ref 98–111)
Creatinine, Ser: 1.11 mg/dL (ref 0.61–1.24)
GFR, Estimated: >60 mL/min (ref >60)
Glucose, Bld: 304 mg/dL — ABNORMAL HIGH (ref 70–99)
Potassium: 3.7 mmol/L (ref 3.5–5.1)
Sodium: 138 mmol/L (ref 135–145)

## 2021-06-13 LAB — GLUCOSE, CAPILLARY: Glucose-Capillary: 318 mg/dL — ABNORMAL HIGH (ref 70–99)

## 2021-06-13 MED ORDER — FREESTYLE LIBRE 2 SENSOR MISC: 0 refills | Status: AC

## 2021-06-13 MED ORDER — LEVEMIR FLEXPEN 100 UNIT/ML ~~LOC~~ SOPN
15.0000 [IU] | PEN_INJECTOR | Freq: Every day | SUBCUTANEOUS | 3 refills | Status: AC
Start: 2021-06-13 — End: ?

## 2021-06-13 MED ORDER — INSULIN GLARGINE-YFGN 100 UNIT/ML ~~LOC~~ SOLN
14.0000 [IU] | Freq: Every day | SUBCUTANEOUS | Status: DC
  Administered 2021-06-13: 14 [IU] via SUBCUTANEOUS
  Filled 2021-06-13: qty 0.14

## 2021-06-13 MED ORDER — INSULIN PEN NEEDLE 32G X 4 MM MISC: 3 refills | Status: AC

## 2021-06-13 MED ORDER — HUMALOG KWIKPEN 200 UNIT/ML ~~LOC~~ SOPN: 4.0000 [IU] | PEN_INJECTOR | Freq: Three times a day (TID) | SUBCUTANEOUS | 3 refills | Status: AC

## 2021-06-13 NOTE — Plan of Care (Signed)

## 2021-06-13 NOTE — Discharge Summary (Signed)
[facility]  Physician Discharge Summary   Patient ID: Eric Leonard MRN: [id number] DOB/AGE: [birth year] 61 y.o.  Admit date: 06/11/2021 Discharge date: 06/13/2021    PCP: Pcp, No  DISCHARGE DIAGNOSES:  New onset DM2 (diabetes mellitus, type 2) ([place])   Malignant neoplasm of prostate ([place])   AKI (acute kidney injury) ([place])   HTN (hypertension)   HLD (hyperlipidemia)      RECOMMENDATIONS FOR OUTPATIENT FOLLOW UP: Patient to follow-up with his primary care provider within the next 1 week HbA1c results are pending.    Home Health: None Equipment/Devices: None  CODE STATUS: Full code  DISCHARGE CONDITION: fair  Diet recommendation: Modified carbohydrate  INITIAL HISTORY: 61 y.o. male with medical history significant of HTN, HLD, prostate CA. presented with left-sided flank pain and was noted to be hyperglycemic.  He was hospitalized for new onset diabetes.  Anion gap was mildly elevated.     HOSPITAL COURSE:   New onset diabetes mellitus type 2/early diabetic ketoacidosis Patient was hospitalized and placed on IV insulin.  Anion gap closed.  He was transitioned to subcutaneous insulin.  Dose will be adjusted today due to poorly controlled glucose levels.  He was seen by diabetes coordinator and was provided education regarding his new onset diabetes.   HbA1c was ordered at the time of admission and remains pending at the time of discharge.  He was told to discuss this further with his PCP at follow-up. Patient will be discharged on Levemir and Humalog. Freestyle libre glucose monitoring system has been prescribed.   Flank pain CT scan did not show any acute findings.  Symptoms appear to have resolved.  Could have been due to his hyperglycemia and early DKA.   Essential hypertension Stable.  Continue with HCTZ.  Consider ACE inhibitor at follow-up.   Acute kidney injury Likely due to hypovolemia.  Now back to baseline.  History of prostate cancer Stable.   Follows with urology.  Abnormal UA noted.  Patient denies any dysuria.  He did have polyuria which could have been due to hyperglycemia.  Was not prescribed antibiotics.  Hyperlipidemia Continue statin.   Patient is stable.  Feels better.  Okay for discharge home today.   PERTINENT LABS:  The results of significant diagnostics from this hospitalization (including imaging, microbiology, ancillary and laboratory) are listed below for reference.    Microbiology: Recent Results (from the past 240 hour(s))  Urine Culture     Status: Abnormal   Collection Time: 06/11/21  2:16 PM   Specimen: Urine, Clean Catch  Result Value Ref Range Status   Specimen Description   Final    URINE, CLEAN CATCH Performed at [facility], [place] [address], [place], [place] [address]    Special Requests   Final    NONE Performed at [facility], [place] [address], [place], [place] [address]    Culture (A)  Final    >=100,000 COLONIES/mL GROUP B STREP(S.AGALACTIAE)ISOLATED TESTING AGAINST S. AGALACTIAE NOT ROUTINELY PERFORMED DUE TO PREDICTABILITY OF AMP/PEN/VAN SUSCEPTIBILITY. Performed at [facility], [place] [address] [place], [place] [address]    Report Status 06/12/2021 FINAL  Final  MRSA Next Gen by PCR, Nasal     Status: None   Collection Time: 06/11/21 10:11 PM   Specimen: Nasal Mucosa; Nasal Swab  Result Value Ref Range Status   MRSA by PCR Next Gen NOT DETECTED NOT DETECTED Final    Comment: (NOTE) The GeneXpert MRSA Assay (FDA approved for NASAL  specimens only), is one component of a comprehensive MRSA colonization surveillance program. It is not intended to diagnose MRSA infection nor to guide or monitor treatment for MRSA infections. Test performance is not FDA approved in patients less than 46 years old. Performed at [facility], [place] [address], [place], [place] [address]      Labs:   Basic Metabolic Panel: Recent Labs   Lab 06/11/21 1730 06/11/21 1943 06/11/21 2200 06/12/21 0047 06/12/21 0422 06/13/21 0420  NA 143 145 142 144 146* 138  K 4.4 3.6 3.2* 3.4* 3.6 3.7  CL 106 113* 110 112* 114* 106  CO2 '22 26 27 27 28 22  '$ GLUCOSE 417* 142* 201* 156* 59* 304*  BUN 22* '19 18 16 16 12  '$ CREATININE 1.12 0.90 0.99 0.98 1.05 1.11  CALCIUM 9.2 9.1 8.4* 8.8* 9.1 8.2*  MG 2.3  --   --   --   --   --    Liver Function Tests: Recent Labs  Lab 06/11/21 1415  AST 21  ALT 31  ALKPHOS 103  BILITOT 1.5*  PROT 7.7  ALBUMIN 3.8   Recent Labs  Lab 06/11/21 1415  LIPASE 38    CBC: Recent Labs  Lab 06/11/21 1415 06/13/21 0420  WBC 4.8 4.0  NEUTROABS 2.6  --   HGB 16.3 13.4  HCT 45.2 39.5  MCV 89.9 93.4  PLT 285 216     CBG: Recent Labs  Lab 06/12/21 0953 06/12/21 1059 06/12/21 1624 06/12/21 2138 06/13/21 0736  GLUCAP 221* 232* 278* 347* 318*     IMAGING STUDIES CT Renal Stone Study  Result Date: 06/11/2021 CLINICAL DATA:  Flank pain, kidney stone suspected. Left costovertebral angle and flank pain. EXAM: CT ABDOMEN AND PELVIS WITHOUT CONTRAST TECHNIQUE: Multidetector CT imaging of the abdomen and pelvis was performed following the standard protocol without IV contrast. RADIATION DOSE REDUCTION: This exam was performed according to the departmental dose-optimization program which includes automated exposure control, adjustment of the mA and/or kV according to patient size and/or use of iterative reconstruction technique. COMPARISON:  None Available. FINDINGS: Lower chest: No acute abnormality. Hepatobiliary: No focal liver abnormality is seen. No gallstones, gallbladder wall thickening, or biliary dilatation. Pancreas: Unremarkable. No pancreatic ductal dilatation or surrounding inflammatory changes. Spleen: Normal in size without focal abnormality. Adrenals/Urinary Tract: Adrenal glands are unremarkable. Kidneys are normal, without renal calculi, focal lesion, or hydronephrosis. Mildly dilated  left ureter without evidence of ureteral calculus or evidence of fatty fat stranding, likely a chronic process. Bladder is unremarkable. Stomach/Bowel: Stomach is within normal limits. Appendix appears normal. No evidence of bowel wall thickening, distention, or inflammatory changes. Vascular/Lymphatic: No significant vascular findings are present. No enlarged abdominal or pelvic lymph nodes. Reproductive: Multiple brachytherapy seeds in the prostate. Other: Fat containing small left inguinal hernia. There is also fat containing umbilical hernia. No evidence of obstruction. No ascites Musculoskeletal: No acute or significant osseous findings. IMPRESSION: 1. No evidence of nephrolithiasis or hydronephrosis. No ureteral calculus. No evidence of recently passed calculus. 2. Bowel loops are normal in caliber. Normal appendix. No evidence of colitis or diverticulitis. 3.  Multiple brachytherapy seeds in the prostate. 4.  No CT evidence of acute abdominal/pelvic process. Electronically Signed   By: Keane Police D.O.   On: 06/11/2021 15:02    DISCHARGE EXAMINATION: Vitals:   06/12/21 2120 06/13/21 0128 06/13/21 0540 06/13/21 1005  BP: 108/61 109/67 122/76 136/86  Pulse: (!) 110 (!) 102 97 (!) 105  Resp:  $'18 18 18 20  'j$ Temp: 98.1 F (36.7 C) 99 F (37.2 C) 97.7 F (36.5 C) (!) 97.3 F (36.3 C)  TempSrc: Oral Oral Oral Oral  SpO2: 95% 98% 98%   Weight:      Height:       General appearance: Awake alert.  In no distress Resp: Clear to auscultation bilaterally.  Normal effort Cardio: S1-S2 is normal regular.  No S3-S4.  No rubs murmurs or bruit GI: Abdomen is soft.  Nontender nondistended.  Bowel sounds are present normal.  No masses organomegaly   DISPOSITION: Home  Discharge Instructions     Ambulatory referral to Nutrition and Diabetic Education   Complete by: As directed    New diagnosis Diabetes.  D/C home Levemir + Humalog.  PCP: Dr. Vista Lawman.   Call MD for:  difficulty breathing, headache  or visual disturbances   Complete by: As directed    Call MD for:  extreme fatigue   Complete by: As directed    Call MD for:  persistant dizziness or light-headedness   Complete by: As directed    Call MD for:  persistant nausea and vomiting   Complete by: As directed    Call MD for:  severe uncontrolled pain   Complete by: As directed    Call MD for:  temperature >100.4   Complete by: As directed    Diet Carb Modified   Complete by: As directed    Discharge instructions   Complete by: As directed    Take your medications as prescribed.  Please be sure to check your glucose levels and keep a log for your primary care provider.  Please be sure to follow-up with your primary care provider within the next 4 to 5 days.  You were cared for by a hospitalist during your hospital stay. If you have any questions about your discharge medications or the care you received while you were in the hospital after you are discharged, you can call the unit and asked to speak with the hospitalist on call if the hospitalist that took care of you is not available. Once you are discharged, your primary care physician will handle any further medical issues. Please note that NO REFILLS for any discharge medications will be authorized once you are discharged, as it is imperative that you return to your primary care physician (or establish a relationship with a primary care physician if you do not have one) for your aftercare needs so that they can reassess your need for medications and monitor your lab values. If you do not have a primary care physician, you can call [phone] for a physician referral.   Increase activity slowly   Complete by: As directed           Allergies as of 06/13/2021   No Known Allergies      Medication List     STOP taking these medications    ciprofloxacin 500 MG tablet Commonly known as: CIPRO   HYDROcodone-acetaminophen 10-325 MG tablet Commonly known as: NORCO        TAKE these medications    acetaminophen 500 MG tablet Commonly known as: TYLENOL Take 1,000 mg by mouth every 6 (six) hours as needed for moderate pain.   atorvastatin 20 MG tablet Commonly known as: LIPITOR Take 20 mg by mouth daily.   cholecalciferol 25 MCG (1000 UNIT) tablet Commonly known as: VITAMIN D3 Take 1,000 Units by mouth daily.   FreeStyle Libre 2 [employer] Use as  directed   HumaLOG KwikPen 200 UNIT/ML KwikPen Generic drug: insulin lispro Inject 4 Units into the skin 3 (three) times daily before meals.   hydrochlorothiazide 25 MG tablet Commonly known as: HYDRODIURIL Take 25 mg by mouth every evening.   Insulin Pen Needle 32G X 4 MM Misc Use as directed   Levemir FlexPen 100 UNIT/ML FlexPen Generic drug: insulin detemir Inject 15 Units into the skin daily.   multivitamin tablet Take 1 tablet by mouth daily.   solifenacin 5 MG tablet Commonly known as: VESICARE Take 5 mg by mouth daily.          Follow-up Information     Osei-Bonsu, Iona Beard, MD. Schedule an appointment as soon as possible for a visit in 5 day(s).   Specialty: Internal Medicine Contact information: [address] [place] [address] [phone]                 TOTAL DISCHARGE TIME: 24 minutes  [facility] Pager on [email or web address]  06/13/2021, 11:42 AM

## 2021-06-14 LAB — HEMOGLOBIN A1C
Hgb A1c MFr Bld: >15.5 % — ABNORMAL HIGH (ref 4.8–5.6)
Mean Plasma Glucose: >398 mg/dL

## 2021-06-15 DIAGNOSIS — Z794 Long term (current) use of insulin: Secondary | ICD-10-CM | POA: Diagnosis not present

## 2021-06-15 DIAGNOSIS — E782 Mixed hyperlipidemia: Secondary | ICD-10-CM | POA: Diagnosis not present

## 2021-06-15 DIAGNOSIS — I10 Essential (primary) hypertension: Secondary | ICD-10-CM | POA: Diagnosis not present

## 2021-06-15 DIAGNOSIS — E1165 Type 2 diabetes mellitus with hyperglycemia: Secondary | ICD-10-CM | POA: Diagnosis not present

## 2021-06-19 DIAGNOSIS — E1165 Type 2 diabetes mellitus with hyperglycemia: Secondary | ICD-10-CM | POA: Diagnosis not present

## 2021-06-19 DIAGNOSIS — Z794 Long term (current) use of insulin: Secondary | ICD-10-CM | POA: Diagnosis not present

## 2021-06-19 DIAGNOSIS — I10 Essential (primary) hypertension: Secondary | ICD-10-CM | POA: Diagnosis not present

## 2021-06-19 DIAGNOSIS — E782 Mixed hyperlipidemia: Secondary | ICD-10-CM | POA: Diagnosis not present

## 2021-06-22 DIAGNOSIS — Z794 Long term (current) use of insulin: Secondary | ICD-10-CM | POA: Diagnosis not present

## 2021-06-22 DIAGNOSIS — E1165 Type 2 diabetes mellitus with hyperglycemia: Secondary | ICD-10-CM | POA: Diagnosis not present

## 2021-06-22 DIAGNOSIS — I10 Essential (primary) hypertension: Secondary | ICD-10-CM | POA: Diagnosis not present

## 2021-06-24 ENCOUNTER — Ambulatory Visit (INDEPENDENT_AMBULATORY_CARE_PROVIDER_SITE_OTHER): Payer: BC Managed Care – PPO | Admitting: Podiatry

## 2021-06-24 DIAGNOSIS — E119 Type 2 diabetes mellitus without complications: Secondary | ICD-10-CM

## 2021-06-24 DIAGNOSIS — S91209A Unspecified open wound of unspecified toe(s) with damage to nail, initial encounter: Secondary | ICD-10-CM | POA: Diagnosis not present

## 2021-06-28 NOTE — Progress Notes (Signed)
  Subjective:  Patient ID: Eric Leonard, male    DOB: [birth year],  MRN: [id number]  Chief Complaint  Patient presents with   Diabetes    61 y.o. male presents with the above complaint. History confirmed with patient.  He was recently diagnosed with diabetes, his A1c is 15.5% he is due for another check soon now that he is on medications, the right great toenail came off uneventfully has not had infection and seems to be healing him  Objective:  Physical Exam: warm, good capillary refill, no trophic changes or ulcerative lesions, normal DP and PT pulses, normal monofilament exam, normal sensory exam, and prior right great toenail avulsion, healing well no signs of infection no ulceration.  Assessment:   1. Encounter for diabetic foot exam ([facility])   2. Traumatic avulsion of nail plate of toe, initial encounter      Plan:  Patient was evaluated and treated and all questions answered.  Patient educated on diabetes. Discussed proper diabetic foot care and discussed risks and complications of disease. Educated patient in depth on reasons to return to the office immediately should he/she discover anything concerning or new on the feet. All questions answered. Discussed proper shoes as well.    Nail seems to be healing well, leave open to air and continue to heal, return sooner if issues with this  Return in about 1 year (around 06/25/2022) for diabetic foot exam .

## 2021-07-05 DIAGNOSIS — H35413 Lattice degeneration of retina, bilateral: Secondary | ICD-10-CM | POA: Diagnosis not present

## 2021-07-05 DIAGNOSIS — E119 Type 2 diabetes mellitus without complications: Secondary | ICD-10-CM | POA: Diagnosis not present

## 2021-07-25 DIAGNOSIS — Z794 Long term (current) use of insulin: Secondary | ICD-10-CM | POA: Diagnosis not present

## 2021-07-25 DIAGNOSIS — E782 Mixed hyperlipidemia: Secondary | ICD-10-CM | POA: Diagnosis not present

## 2021-07-25 DIAGNOSIS — E1165 Type 2 diabetes mellitus with hyperglycemia: Secondary | ICD-10-CM | POA: Diagnosis not present

## 2021-07-25 DIAGNOSIS — I10 Essential (primary) hypertension: Secondary | ICD-10-CM | POA: Diagnosis not present

## 2021-07-26 DIAGNOSIS — H2512 Age-related nuclear cataract, left eye: Secondary | ICD-10-CM | POA: Diagnosis not present

## 2021-07-26 DIAGNOSIS — Z01818 Encounter for other preprocedural examination: Secondary | ICD-10-CM | POA: Diagnosis not present

## 2021-08-14 ENCOUNTER — Encounter: Payer: BC Managed Care – PPO | Attending: Internal Medicine | Admitting: Dietician

## 2021-08-14 ENCOUNTER — Encounter: Payer: Self-pay | Admitting: Dietician

## 2021-08-14 DIAGNOSIS — E119 Type 2 diabetes mellitus without complications: Secondary | ICD-10-CM | POA: Diagnosis not present

## 2021-08-14 DIAGNOSIS — Z713 Dietary counseling and surveillance: Secondary | ICD-10-CM | POA: Insufficient documentation

## 2021-08-14 NOTE — Progress Notes (Signed)
Diabetes Self-Management Education  Visit Type: First/Initial  Appt. Start Time: 0930 Appt. End Time: 3875  08/14/2021  [name], identified by name and date of birth, is a 61 y.o. male with a diagnosis of Diabetes: Type 2.   ASSESSMENT Patient is here today alone.    History includes:  Newly diagnosed type 2 diabetes, Prostate cancer (remission), GERD, HTN Medications include:  Levemir 10 units, Humalog 5 units before each meal, glipizide.  Labs noted to include:  A1C: >15.5% (05/2021) Sleep: ~5hr per day  Wt History: 217lbs. 230lbs (05/2020) pre-diagnosis Dropped to 190lbs (05/2021)  Height: 75 inches  Libre 2: Fasting sensor reading '125mg'$ /dL this am  Patient lives alone and works nights at [employer]. He does maintenance, walks some at work.  Height '6\' 3"'$  (1.905 m), weight 217 lb (98.4 kg). Body mass index is 27.12 kg/m.   Diabetes Self-Management Education - 08/14/21 0946       Visit Information   Visit Type First/Initial      Initial Visit   Diabetes Type Type 2    Date Diagnosed 05/2021    Are you currently following a meal plan? No    Are you taking your medications as prescribed? Yes      Health Coping   How would you rate your overall health? Good      Psychosocial Assessment   Patient Belief/Attitude about Diabetes Motivated to manage diabetes    What is the hardest part about your diabetes right now, causing you the most concern, or is the most worrisome to you about your diabetes?   Other (comment)   notes no concerns   Self-care barriers None    Self-management support Doctor's office    Other persons present Patient    Patient Concerns Nutrition/Meal planning    Special Needs None    Preferred Learning Style No preference indicated    Learning Readiness Ready    How often do you need to have someone help you when you read instructions, pamphlets, or other written materials from your doctor or pharmacy? 1 - Never    What is the last  grade level you completed in school? 2 years college      Pre-Education Assessment   Patient understands the diabetes disease and treatment process. Needs Review    Patient understands incorporating nutritional management into lifestyle. Needs Review    Patient undertands incorporating physical activity into lifestyle. Needs Review    Patient understands using medications safely. Needs Review    Patient understands monitoring blood glucose, interpreting and using results Needs Review    Patient understands prevention, detection, and treatment of acute complications. Needs Review    Patient understands prevention, detection, and treatment of chronic complications. Needs Review    Patient understands how to develop strategies to address psychosocial issues. Needs Review    Patient understands how to develop strategies to promote health/change behavior. Needs Review      Complications   Last HgB A1C per patient/outside source 15.5 %    How often do you check your blood sugar? > 4 times/day    Fasting Blood glucose range (mg/dL) 70-129    Postprandial Blood glucose range (mg/dL) 130-179    Number of hypoglycemic episodes per month 1    Can you tell when your blood sugar is low? Yes    What do you do if your blood sugar is low? eats fruit    Number of hyperglycemic episodes ( >'200mg'$ /dL): Rare  Have you had a dilated eye exam in the past 12 months? Yes    Have you had a dental exam in the past 12 months? No    Are you checking your feet? Yes    How many days per week are you checking your feet? 7      Dietary Intake   Breakfast nuts and drink    Lunch bologne sandwich on whole wheat bread and salad   2am   Dinner burger OR baked chicken and salad and fruit   8am   Snack (evening) occasional low sugar ice cream    Beverage(s) water, zero sugar flavored water, occasional unsweet tea      Activity / Exercise   Activity / Exercise Type Light (walking / raking leaves);Strenuous (running)     How many days per week do you exercise? 4    How many minutes per day do you exercise? 60    Total minutes per week of exercise 240      Patient Education   Previous Diabetes Education Yes (please comment)   at hospital   Disease Pathophysiology Definition of diabetes, type 1 and 2, and the diagnosis of diabetes;Factors that contribute to the development of diabetes    Healthy Eating Role of diet in the treatment of diabetes and the relationship between the three main macronutrients and blood glucose level;Food label reading, portion sizes and measuring food.;Plate Method;Meal options for control of blood glucose level and chronic complications.    Being Active Role of exercise on diabetes management, blood pressure control and cardiac health.    Medications Reviewed patients medication for diabetes, action, purpose, timing of dose and side effects.    Monitoring Identified appropriate SMBG and/or A1C goals.;Daily foot exams;Yearly dilated eye exam    Acute complications Taught prevention, symptoms, and  treatment of hypoglycemia - the 15 rule.    Chronic complications Relationship between chronic complications and blood glucose control;Assessed and discussed foot care and prevention of foot problems    Diabetes Stress and Support Role of stress on diabetes;Identified and addressed patients feelings and concerns about diabetes      Individualized Goals (developed by patient)   Nutrition General guidelines for healthy choices and portions discussed;Follow meal plan discussed    Physical Activity Exercise 3-5 times per week;60 minutes per day    Medications take my medication as prescribed    Monitoring  Consistenly use CGM    Problem Solving Sleep Pattern;Eating Pattern    Reducing Risk examine blood glucose patterns;do foot checks daily;treat hypoglycemia with 15 grams of carbs if blood glucose less than '70mg'$ /dL      Post-Education Assessment   Patient understands the diabetes disease and  treatment process. Demonstrates understanding / competency    Patient understands incorporating nutritional management into lifestyle. Comprehends key points    Patient undertands incorporating physical activity into lifestyle. Demonstrates understanding / competency    Patient understands using medications safely. Demonstrates understanding / competency    Patient understands monitoring blood glucose, interpreting and using results Demonstrates understanding / competency    Patient understands prevention, detection, and treatment of acute complications. Demonstrates understanding / competency    Patient understands prevention, detection, and treatment of chronic complications. Demonstrates understanding / competency    Patient understands how to develop strategies to address psychosocial issues. Demonstrates understanding / competency    Patient understands how to develop strategies to promote health/change behavior. Comprehends key points      Outcomes   Expected Outcomes  Demonstrated interest in learning. Expect positive outcomes    Future DMSE 6 months    Program Status Not Completed             Individualized Plan for Diabetes Self-Management Training:   Learning Objective:  Patient will have a greater understanding of diabetes self-management. Patient education plan is to attend individual and/or group sessions per assessed needs and concerns.   Plan:   Patient Instructions  Plan:  Aim for 3-4 Carb Choices per meal (45-60 grams) +/- 1 either way  Aim for 0-15 Carbs per snack if hungry  Include protein in moderation with your meals and snacks Consider reading food labels for Total Carbohydrate of foods Continue checking BG at alternate times per day  Continue taking medication as directed by MD   Expected Outcomes:  Demonstrated interest in learning. Expect positive outcomes  Education material provided: ADA - How to Thrive: A Guide for Your Journey with Diabetes, Food  label handouts, Meal plan card, Snack sheet, and Diabetes Resources  If problems or questions, patient to contact team via:  Phone  Future DSME appointment: 6 months

## 2021-08-14 NOTE — Patient Instructions (Signed)
Plan:  Aim for 3-4 Carb Choices per meal (45-60 grams) +/- 1 either way  Aim for 0-15 Carbs per snack if hungry  Include protein in moderation with your meals and snacks Consider reading food labels for Total Carbohydrate of foods Continue checking BG at alternate times per day  Continue taking medication as directed by MD

## 2021-08-18 DIAGNOSIS — H269 Unspecified cataract: Secondary | ICD-10-CM | POA: Diagnosis not present

## 2021-08-18 DIAGNOSIS — H2512 Age-related nuclear cataract, left eye: Secondary | ICD-10-CM | POA: Diagnosis not present

## 2021-08-28 DIAGNOSIS — C61 Malignant neoplasm of prostate: Secondary | ICD-10-CM | POA: Diagnosis not present

## 2021-09-04 DIAGNOSIS — R350 Frequency of micturition: Secondary | ICD-10-CM | POA: Diagnosis not present

## 2021-09-04 DIAGNOSIS — C61 Malignant neoplasm of prostate: Secondary | ICD-10-CM | POA: Diagnosis not present

## 2021-09-04 DIAGNOSIS — N5201 Erectile dysfunction due to arterial insufficiency: Secondary | ICD-10-CM | POA: Diagnosis not present

## 2021-09-07 ENCOUNTER — Ambulatory Visit: Payer: BC Managed Care – PPO | Admitting: Dietician

## 2021-09-11 DIAGNOSIS — Z0001 Encounter for general adult medical examination with abnormal findings: Secondary | ICD-10-CM | POA: Diagnosis not present

## 2021-09-11 DIAGNOSIS — Z125 Encounter for screening for malignant neoplasm of prostate: Secondary | ICD-10-CM | POA: Diagnosis not present

## 2021-09-11 DIAGNOSIS — E782 Mixed hyperlipidemia: Secondary | ICD-10-CM | POA: Diagnosis not present

## 2021-09-11 DIAGNOSIS — I10 Essential (primary) hypertension: Secondary | ICD-10-CM | POA: Diagnosis not present

## 2021-09-11 DIAGNOSIS — Z794 Long term (current) use of insulin: Secondary | ICD-10-CM | POA: Diagnosis not present

## 2021-09-11 DIAGNOSIS — E1165 Type 2 diabetes mellitus with hyperglycemia: Secondary | ICD-10-CM | POA: Diagnosis not present

## 2021-09-15 DIAGNOSIS — H2511 Age-related nuclear cataract, right eye: Secondary | ICD-10-CM | POA: Diagnosis not present

## 2021-09-15 DIAGNOSIS — H269 Unspecified cataract: Secondary | ICD-10-CM | POA: Diagnosis not present

## 2021-09-15 DIAGNOSIS — H2513 Age-related nuclear cataract, bilateral: Secondary | ICD-10-CM | POA: Diagnosis not present

## 2021-09-15 DIAGNOSIS — Z961 Presence of intraocular lens: Secondary | ICD-10-CM | POA: Diagnosis not present

## 2021-10-02 DIAGNOSIS — R7401 Elevation of levels of liver transaminase levels: Secondary | ICD-10-CM | POA: Diagnosis not present

## 2021-10-02 DIAGNOSIS — Z794 Long term (current) use of insulin: Secondary | ICD-10-CM | POA: Diagnosis not present

## 2021-10-02 DIAGNOSIS — E1165 Type 2 diabetes mellitus with hyperglycemia: Secondary | ICD-10-CM | POA: Diagnosis not present

## 2021-10-02 DIAGNOSIS — I10 Essential (primary) hypertension: Secondary | ICD-10-CM | POA: Diagnosis not present

## 2021-12-25 DIAGNOSIS — R7401 Elevation of levels of liver transaminase levels: Secondary | ICD-10-CM | POA: Diagnosis not present

## 2021-12-25 DIAGNOSIS — I10 Essential (primary) hypertension: Secondary | ICD-10-CM | POA: Diagnosis not present

## 2021-12-25 DIAGNOSIS — E1165 Type 2 diabetes mellitus with hyperglycemia: Secondary | ICD-10-CM | POA: Diagnosis not present

## 2021-12-25 DIAGNOSIS — Z794 Long term (current) use of insulin: Secondary | ICD-10-CM | POA: Diagnosis not present

## 2021-12-26 DIAGNOSIS — R7401 Elevation of levels of liver transaminase levels: Secondary | ICD-10-CM | POA: Diagnosis not present

## 2021-12-26 DIAGNOSIS — E1165 Type 2 diabetes mellitus with hyperglycemia: Secondary | ICD-10-CM | POA: Diagnosis not present

## 2021-12-26 DIAGNOSIS — I10 Essential (primary) hypertension: Secondary | ICD-10-CM | POA: Diagnosis not present

## 2021-12-26 DIAGNOSIS — Z794 Long term (current) use of insulin: Secondary | ICD-10-CM | POA: Diagnosis not present

## 2022-01-18 ENCOUNTER — Encounter: Payer: BC Managed Care – PPO | Attending: Internal Medicine | Admitting: Dietician

## 2022-01-18 ENCOUNTER — Encounter: Payer: Self-pay | Admitting: Dietician

## 2022-01-18 VITALS — Wt 233.0 lb

## 2022-01-18 DIAGNOSIS — E119 Type 2 diabetes mellitus without complications: Secondary | ICD-10-CM | POA: Diagnosis not present

## 2022-01-18 NOTE — Patient Instructions (Signed)
Keep up the good work

## 2022-01-18 NOTE — Progress Notes (Signed)
Diabetes Self-Management Education  Visit Type: Follow-up  Appt. Start Time: 0915 Appt. End Time: 0945  01/18/2022  [name], identified by name and date of birth, is a 62 y.o. male with a diagnosis of Diabetes:  .   ASSESSMENT 233 lbs Patient reports A1C of 6.7% 10/2021 decreased from >15.5% 05/2021. States that he needs to change the Levemir to another insulin as his insurance has changed  History includes:  Newly diagnosed type 2 diabetes, Prostate cancer (remission), GERD, HTN Medications include:  Levemir 10 units, Humalog 4 units before each meal, glipizide.  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 Slowed running due to shin splints but has since restarted.  Has a gym membership and goes about 4 days per week. Decreasing his salt intake due to concerns of blood pressure  Weight 233 lb (105.7 kg). Body mass index is 29.12 kg/m.   Diabetes Self-Management Education - 01/18/22 0934       Visit Information   Visit Type Follow-up      Health Coping   How would you rate your overall health? Good      Psychosocial Assessment   Patient Belief/Attitude about Diabetes Motivated to manage diabetes    What is the hardest part about your diabetes right now, causing you the most concern, or is the most worrisome to you about your diabetes?   Other (comment)   nothing, doing well   Self-care barriers None    Self-management support Doctor's office;CDE visits    Other persons present Patient    Patient Concerns Other (comment)   none   Special Needs None    Preferred  Learning Style No preference indicated    Learning Readiness Ready    How often do you need to have someone help you when you read instructions, pamphlets, or other written materials from your doctor or pharmacy? 1 - Never      Pre-Education Assessment   Patient understands the diabetes disease and treatment process. Comprehends key points    Patient understands incorporating nutritional management into lifestyle. Needs Review    Patient undertands incorporating physical activity into lifestyle. Comprehends key points    Patient understands using medications safely. Needs Review    Patient understands monitoring blood glucose, interpreting and using results Needs Review    Patient understands prevention, detection, and treatment of acute complications. Comprehends key points    Patient understands prevention, detection, and treatment of chronic complications. Compreheands key points    Patient understands how to develop strategies to address psychosocial issues. Comprehends key points    Patient understands how to develop strategies to promote health/change behavior. Comprehends key points      Complications   Last HgB A1C per patient/outside source 6.7 %   10/2021 per patient decreased from >15.5% 07/2021   How often do you check your blood sugar? > 4 times/day    Fasting Blood glucose range (mg/dL) 70-129    Postprandial Blood glucose range (mg/dL) 130-179;70-129    Number of hypoglycemic episodes per month 2    Can you tell when  your blood sugar is low? Yes    What do you do if your blood sugar is low? eat fruit    Number of hyperglycemic episodes ( >'200mg'$ /dL): Rare    Have you had a dilated eye exam in the past 12 months? Yes    Are you checking your feet? Yes    How many days per week are you checking your feet? 7      Dietary Intake   Lunch salad with [place], fat free dressing    Snack (afternoon) sugar free ice cream    Dinner chicken wings, carrots, celery    Snack (evening)  apple, unsalted peanuts    Beverage(s) water, flavored water, unsweet tea      Activity / Exercise   Activity / Exercise Type Moderate (swimming / aerobic walking)    How many days per week do you exercise? 4    How many minutes per day do you exercise? 60    Total minutes per week of exercise 240      Patient Education   Previous Diabetes Education Yes (please comment)   07/2021   Healthy Eating Role of diet in the treatment of diabetes and the relationship between the three main macronutrients and blood glucose level;Meal timing in regards to the patients' current diabetes medication.;Meal options for control of blood glucose level and chronic complications.;Reviewed blood glucose goals for pre and post meals and how to evaluate the patients' food intake on their blood glucose level.    Being Active Role of exercise on diabetes management, blood pressure control and cardiac health.;Identified with patient nutritional and/or medication changes necessary with exercise.    Medications Reviewed patients medication for diabetes, action, purpose, timing of dose and side effects.    Monitoring Identified appropriate SMBG and/or A1C goals.;Daily foot exams;Yearly dilated eye exam    Acute complications Taught prevention, symptoms, and  treatment of hypoglycemia - the 15 rule.    Diabetes Stress and Support Identified and addressed patients feelings and concerns about diabetes      Individualized Goals (developed by patient)   Nutrition General guidelines for healthy choices and portions discussed    Physical Activity Exercise 3-5 times per week;60 minutes per day    Medications take my medication as prescribed    Monitoring  Consistenly use CGM    Problem Solving Sleep Pattern    Reducing Risk examine blood glucose patterns;do foot checks daily;treat hypoglycemia with 15 grams of carbs if blood glucose less than '70mg'$ /dL      Patient Self-Evaluation of Goals - Patient rates self as meeting  previously set goals (% of time)   Nutrition >75% (most of the time)    Physical Activity >75% (most of the time)    Medications >75% (most of the time)    Monitoring >75% (most of the time)    Problem Solving and behavior change strategies  >75% (most of the time)    Reducing Risk (treating acute and chronic complications) >05% (most of the time)    Health Coping >75% (most of the time)      Post-Education Assessment   Patient understands the diabetes disease and treatment process. Demonstrates understanding / competency    Patient understands incorporating nutritional management into lifestyle. Demonstrates understanding / competency    Patient undertands incorporating physical activity into lifestyle. Demonstrates understanding / competency    Patient understands using medications safely. Demonstrates understanding / competency    Patient understands monitoring blood glucose, interpreting and using results  Demonstrates understanding / competency    Patient understands prevention, detection, and treatment of acute complications. Demonstrates understanding / competency    Patient understands prevention, detection, and treatment of chronic complications. Demonstrates understanding / competency    Patient understands how to develop strategies to address psychosocial issues. Demonstrates understanding / competency    Patient understands how to develop strategies to promote health/change behavior. Demonstrates understanding / competency      Outcomes   Expected Outcomes Demonstrated interest in learning. Expect positive outcomes    Future DMSE Yearly    Program Status Completed      Subsequent Visit   Since your last visit have you continued or begun to take your medications as prescribed? Yes    Since your last visit have you experienced any weight changes? Gain    Weight Gain (lbs) 15    Since your last visit, are you checking your blood glucose at least once a day? Yes              Individualized Plan for Diabetes Self-Management Training:   Learning Objective:  Patient will have a greater understanding of diabetes self-management. Patient education plan is to attend individual and/or group sessions per assessed needs and concerns.   Plan:   Patient Instructions  Keep up the good work!  Expected Outcomes:  Demonstrated interest in learning. Expect positive outcomes  Education material provided:   If problems or questions, patient to contact team via:  Phone  Future DSME appointment: Yearly

## 2022-02-19 DIAGNOSIS — C61 Malignant neoplasm of prostate: Secondary | ICD-10-CM | POA: Diagnosis not present

## 2022-02-26 DIAGNOSIS — Z8546 Personal history of malignant neoplasm of prostate: Secondary | ICD-10-CM | POA: Diagnosis not present

## 2022-02-26 DIAGNOSIS — N5201 Erectile dysfunction due to arterial insufficiency: Secondary | ICD-10-CM | POA: Diagnosis not present

## 2022-03-26 DIAGNOSIS — E1165 Type 2 diabetes mellitus with hyperglycemia: Secondary | ICD-10-CM | POA: Diagnosis not present

## 2022-03-26 DIAGNOSIS — R7401 Elevation of levels of liver transaminase levels: Secondary | ICD-10-CM | POA: Diagnosis not present

## 2022-03-26 DIAGNOSIS — I10 Essential (primary) hypertension: Secondary | ICD-10-CM | POA: Diagnosis not present

## 2022-03-26 DIAGNOSIS — Z794 Long term (current) use of insulin: Secondary | ICD-10-CM | POA: Diagnosis not present

## 2022-06-18 DIAGNOSIS — E782 Mixed hyperlipidemia: Secondary | ICD-10-CM | POA: Diagnosis not present

## 2022-06-18 DIAGNOSIS — I10 Essential (primary) hypertension: Secondary | ICD-10-CM | POA: Diagnosis not present

## 2022-06-18 DIAGNOSIS — M25561 Pain in right knee: Secondary | ICD-10-CM | POA: Diagnosis not present

## 2022-06-18 DIAGNOSIS — Z0001 Encounter for general adult medical examination with abnormal findings: Secondary | ICD-10-CM | POA: Diagnosis not present

## 2022-06-18 DIAGNOSIS — Z125 Encounter for screening for malignant neoplasm of prostate: Secondary | ICD-10-CM | POA: Diagnosis not present

## 2022-06-18 DIAGNOSIS — Z794 Long term (current) use of insulin: Secondary | ICD-10-CM | POA: Diagnosis not present

## 2022-06-18 DIAGNOSIS — E1165 Type 2 diabetes mellitus with hyperglycemia: Secondary | ICD-10-CM | POA: Diagnosis not present

## 2022-07-02 DIAGNOSIS — E1165 Type 2 diabetes mellitus with hyperglycemia: Secondary | ICD-10-CM | POA: Diagnosis not present

## 2022-07-02 DIAGNOSIS — Z794 Long term (current) use of insulin: Secondary | ICD-10-CM | POA: Diagnosis not present

## 2022-07-02 DIAGNOSIS — E782 Mixed hyperlipidemia: Secondary | ICD-10-CM | POA: Diagnosis not present

## 2022-07-02 DIAGNOSIS — I10 Essential (primary) hypertension: Secondary | ICD-10-CM | POA: Diagnosis not present

## 2022-10-22 DIAGNOSIS — I10 Essential (primary) hypertension: Secondary | ICD-10-CM | POA: Diagnosis not present

## 2022-10-22 DIAGNOSIS — E782 Mixed hyperlipidemia: Secondary | ICD-10-CM | POA: Diagnosis not present

## 2022-10-22 DIAGNOSIS — E1165 Type 2 diabetes mellitus with hyperglycemia: Secondary | ICD-10-CM | POA: Diagnosis not present

## 2022-10-22 DIAGNOSIS — Z794 Long term (current) use of insulin: Secondary | ICD-10-CM | POA: Diagnosis not present

## 2022-12-17 DIAGNOSIS — I10 Essential (primary) hypertension: Secondary | ICD-10-CM | POA: Diagnosis not present

## 2022-12-17 DIAGNOSIS — E1165 Type 2 diabetes mellitus with hyperglycemia: Secondary | ICD-10-CM | POA: Diagnosis not present

## 2022-12-17 DIAGNOSIS — E782 Mixed hyperlipidemia: Secondary | ICD-10-CM | POA: Diagnosis not present

## 2022-12-17 DIAGNOSIS — Z794 Long term (current) use of insulin: Secondary | ICD-10-CM | POA: Diagnosis not present

## 2023-01-03 DIAGNOSIS — E1165 Type 2 diabetes mellitus with hyperglycemia: Secondary | ICD-10-CM | POA: Diagnosis not present

## 2023-01-03 DIAGNOSIS — I10 Essential (primary) hypertension: Secondary | ICD-10-CM | POA: Diagnosis not present

## 2023-01-03 DIAGNOSIS — R7401 Elevation of levels of liver transaminase levels: Secondary | ICD-10-CM | POA: Diagnosis not present

## 2023-01-03 DIAGNOSIS — Z794 Long term (current) use of insulin: Secondary | ICD-10-CM | POA: Diagnosis not present

## 2023-02-25 DIAGNOSIS — C61 Malignant neoplasm of prostate: Secondary | ICD-10-CM | POA: Diagnosis not present

## 2023-03-04 DIAGNOSIS — Z8546 Personal history of malignant neoplasm of prostate: Secondary | ICD-10-CM | POA: Diagnosis not present

## 2023-03-04 DIAGNOSIS — N5201 Erectile dysfunction due to arterial insufficiency: Secondary | ICD-10-CM | POA: Diagnosis not present

## 2023-04-18 DIAGNOSIS — Z0001 Encounter for general adult medical examination with abnormal findings: Secondary | ICD-10-CM | POA: Diagnosis not present

## 2023-04-18 DIAGNOSIS — I10 Essential (primary) hypertension: Secondary | ICD-10-CM | POA: Diagnosis not present

## 2023-04-18 DIAGNOSIS — R7401 Elevation of levels of liver transaminase levels: Secondary | ICD-10-CM | POA: Diagnosis not present

## 2023-04-18 DIAGNOSIS — Z794 Long term (current) use of insulin: Secondary | ICD-10-CM | POA: Diagnosis not present

## 2023-04-18 DIAGNOSIS — E1165 Type 2 diabetes mellitus with hyperglycemia: Secondary | ICD-10-CM | POA: Diagnosis not present

## 2023-05-02 DIAGNOSIS — I10 Essential (primary) hypertension: Secondary | ICD-10-CM | POA: Diagnosis not present

## 2023-05-02 DIAGNOSIS — E1165 Type 2 diabetes mellitus with hyperglycemia: Secondary | ICD-10-CM | POA: Diagnosis not present

## 2023-05-02 DIAGNOSIS — Z794 Long term (current) use of insulin: Secondary | ICD-10-CM | POA: Diagnosis not present

## 2023-09-12 DIAGNOSIS — Z794 Long term (current) use of insulin: Secondary | ICD-10-CM | POA: Diagnosis not present

## 2023-09-12 DIAGNOSIS — E1165 Type 2 diabetes mellitus with hyperglycemia: Secondary | ICD-10-CM | POA: Diagnosis not present

## 2023-09-12 DIAGNOSIS — I10 Essential (primary) hypertension: Secondary | ICD-10-CM | POA: Diagnosis not present

## 2023-09-12 DIAGNOSIS — E782 Mixed hyperlipidemia: Secondary | ICD-10-CM | POA: Diagnosis not present

## 2023-10-10 DIAGNOSIS — I10 Essential (primary) hypertension: Secondary | ICD-10-CM | POA: Diagnosis not present

## 2023-10-10 DIAGNOSIS — E1165 Type 2 diabetes mellitus with hyperglycemia: Secondary | ICD-10-CM | POA: Diagnosis not present

## 2023-10-10 DIAGNOSIS — E782 Mixed hyperlipidemia: Secondary | ICD-10-CM | POA: Diagnosis not present

## 2023-10-10 DIAGNOSIS — Z794 Long term (current) use of insulin: Secondary | ICD-10-CM | POA: Diagnosis not present

## 2023-12-19 DIAGNOSIS — E1165 Type 2 diabetes mellitus with hyperglycemia: Secondary | ICD-10-CM | POA: Diagnosis not present

## 2023-12-19 DIAGNOSIS — Z794 Long term (current) use of insulin: Secondary | ICD-10-CM | POA: Diagnosis not present

## 2023-12-19 DIAGNOSIS — E782 Mixed hyperlipidemia: Secondary | ICD-10-CM | POA: Diagnosis not present

## 2023-12-19 DIAGNOSIS — I10 Essential (primary) hypertension: Secondary | ICD-10-CM | POA: Diagnosis not present

## 2023-12-26 DIAGNOSIS — E1165 Type 2 diabetes mellitus with hyperglycemia: Secondary | ICD-10-CM | POA: Diagnosis not present

## 2023-12-26 DIAGNOSIS — E782 Mixed hyperlipidemia: Secondary | ICD-10-CM | POA: Diagnosis not present

## 2023-12-26 DIAGNOSIS — Z794 Long term (current) use of insulin: Secondary | ICD-10-CM | POA: Diagnosis not present

## 2023-12-26 DIAGNOSIS — I10 Essential (primary) hypertension: Secondary | ICD-10-CM | POA: Diagnosis not present

## 2024-05-09 IMAGING — CT CT RENAL STONE PROTOCOL
2 of 4 series · 15 of 46 positions shown, 17 images · non-contrast
Comparison: None Available.

CLINICAL DATA: Flank pain, kidney stone suspected. Left
costovertebral angle and flank pain.



[Series 2: axial st · axial · 0.84mm/px · z∈[+127,+597]mm · 12 of 108 slices shown, 14 images]
[im 7/108  soft-tissue]
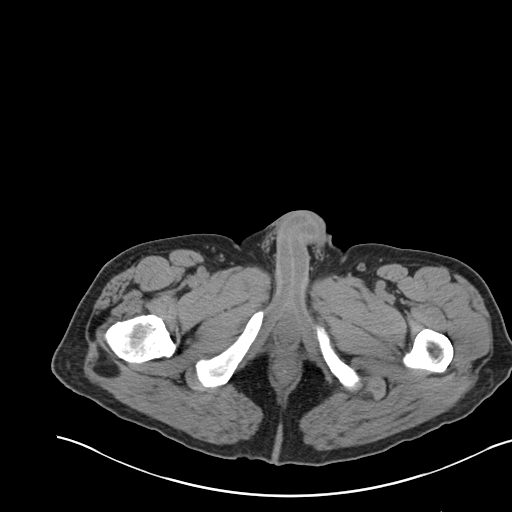
[im 7/108  bone]
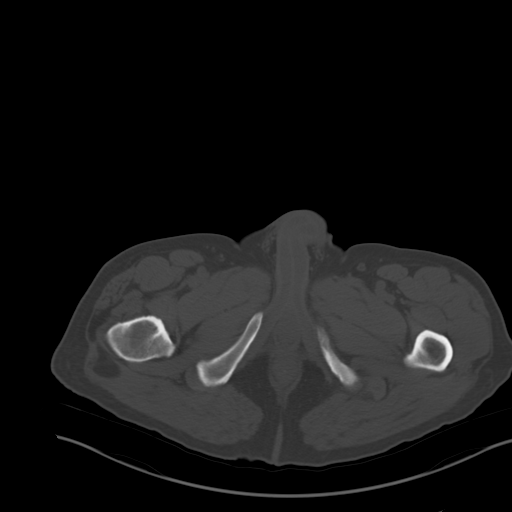
[im 19/108  soft-tissue]
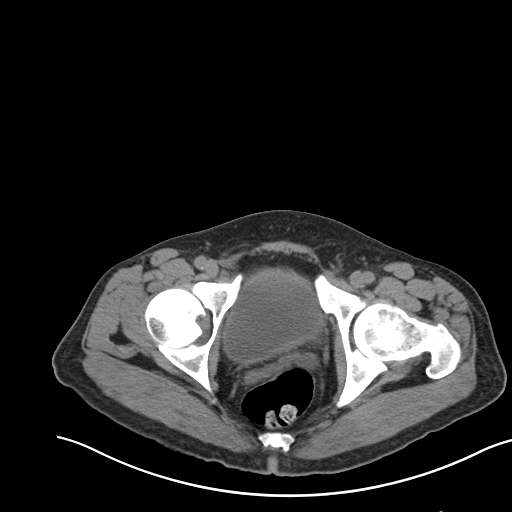
[im 26/108  soft-tissue]
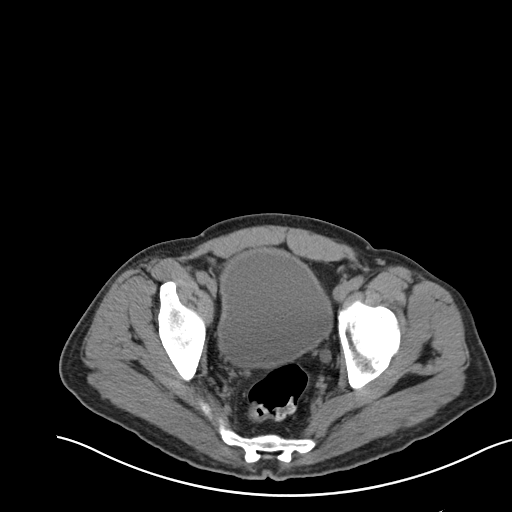
[im 32/108  soft-tissue]
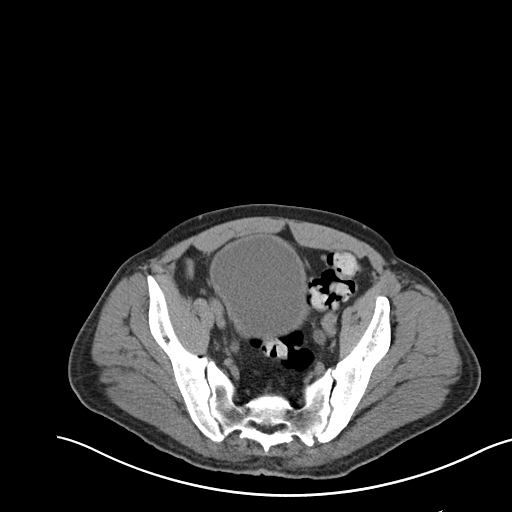
[im 45/108  soft-tissue]
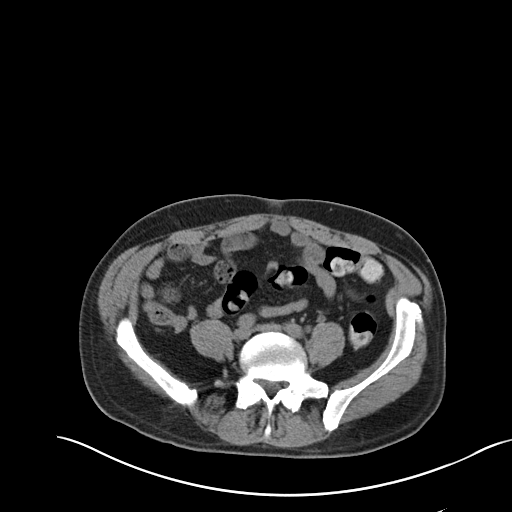
[im 51/108  soft-tissue]
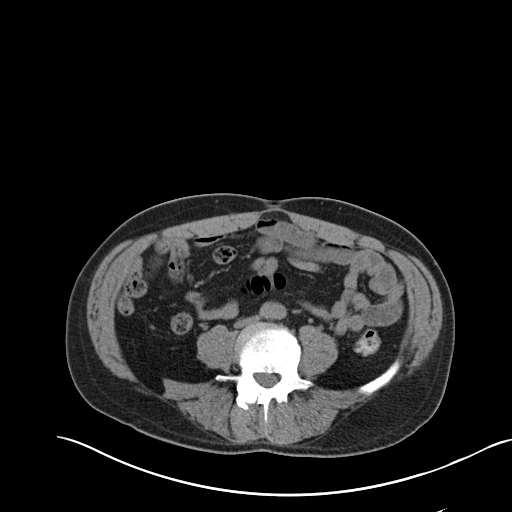
[im 57/108  soft-tissue]
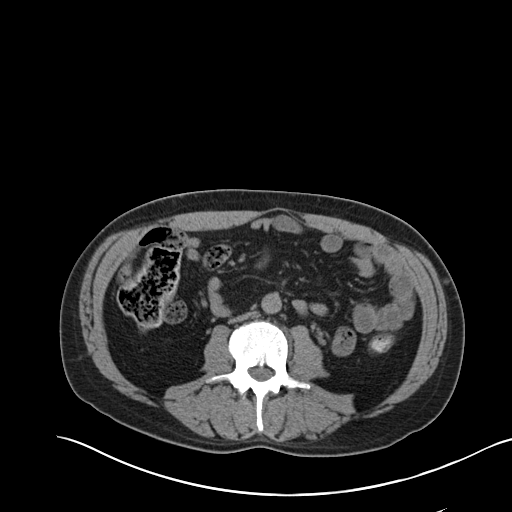
[im 70/108  soft-tissue]
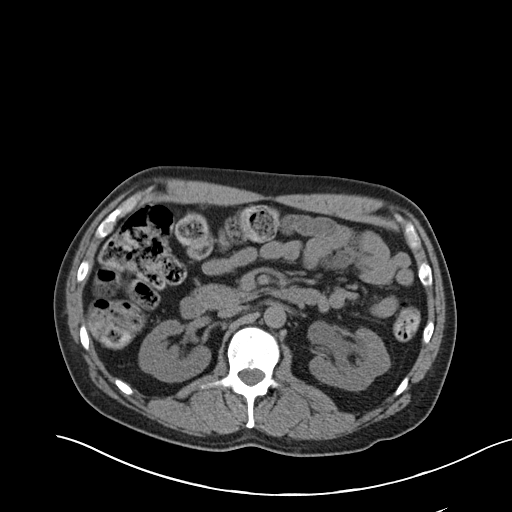
[im 76/108  soft-tissue]
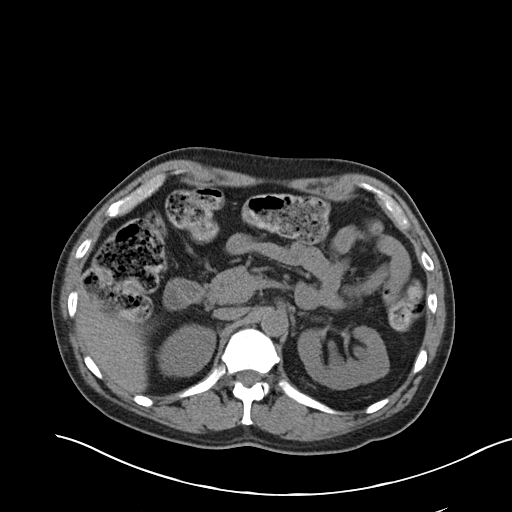
[im 76/108  bone]
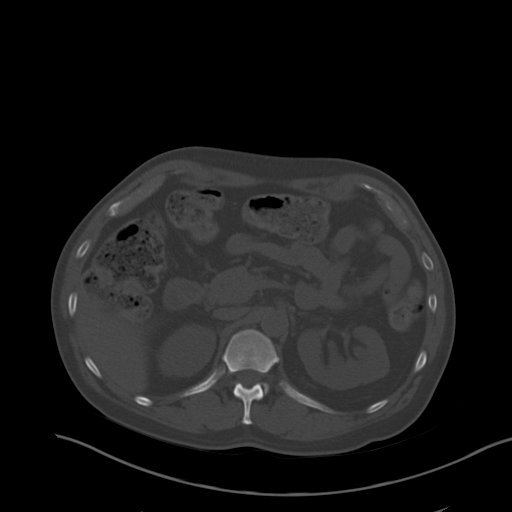
[im 82/108  soft-tissue]
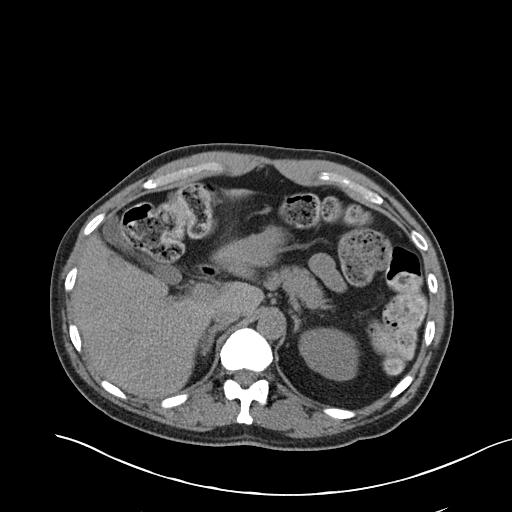
[im 95/108  soft-tissue]
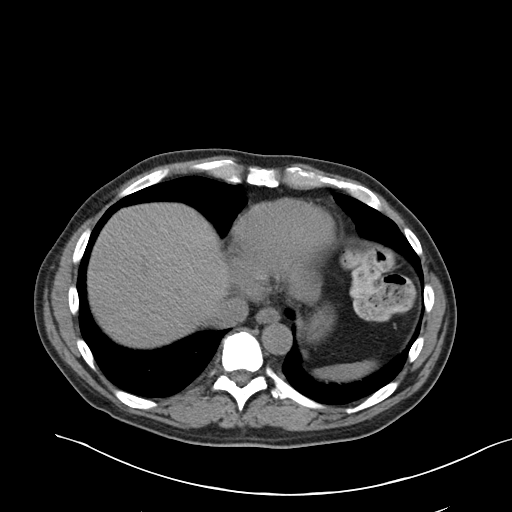
[im 101/108  soft-tissue]
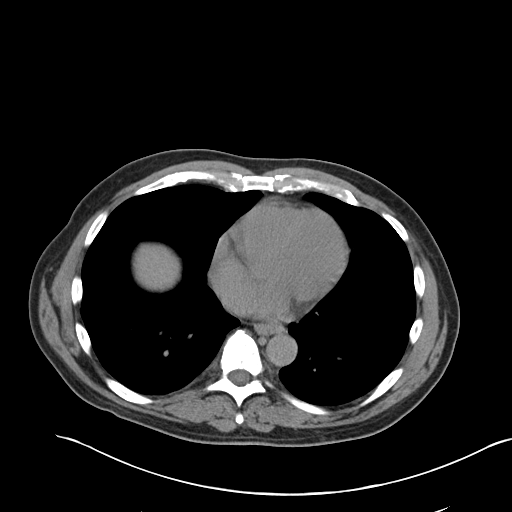

[Series 4: coronal · coronal · 0.77mm/px · 3 of 151 slices shown]
[im 51/151  soft-tissue]
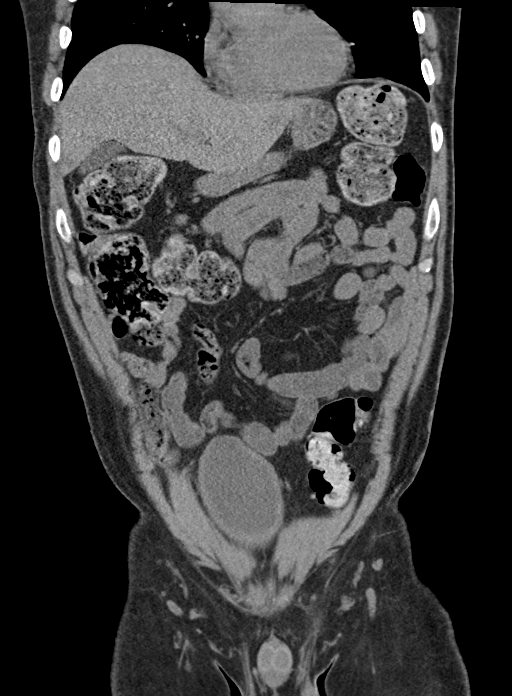
[im 67/151  soft-tissue]
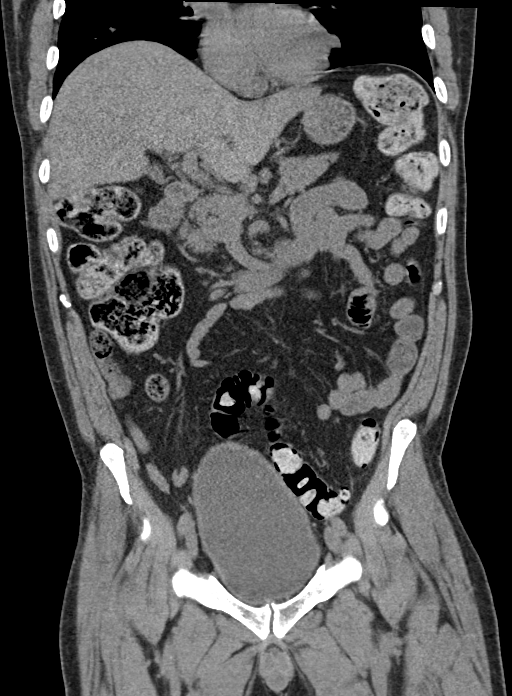
[im 84/151  soft-tissue]
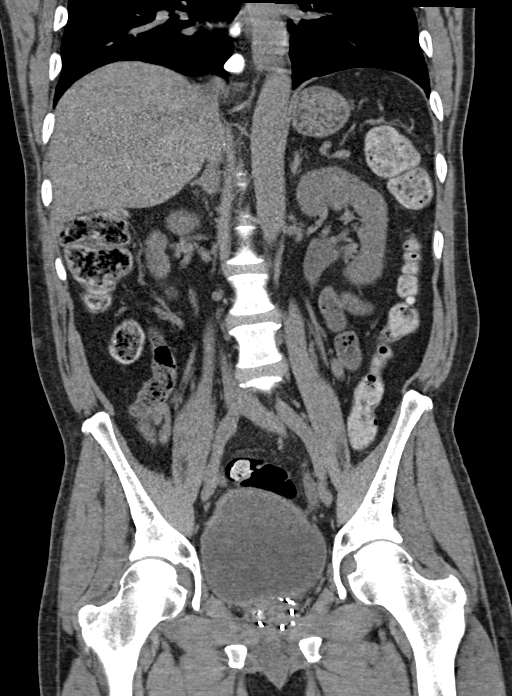

[15 of 46 positions shown; findings below may reference images not displayed]

FINDINGS: Lower chest: No acute abnormality.

Hepatobiliary: No focal liver abnormality is seen. No gallstones,
gallbladder wall thickening, or biliary dilatation.

Pancreas: Unremarkable. No pancreatic ductal dilatation or
surrounding inflammatory changes.

Spleen: Normal in size without focal abnormality.

Adrenals/Urinary Tract: Adrenal glands are unremarkable. Kidneys are
normal, without renal calculi, focal lesion, or hydronephrosis.
Mildly dilated left ureter without evidence of ureteral calculus or
evidence of fatty fat stranding, likely a chronic process. Bladder
is unremarkable.

Stomach/Bowel: Stomach is within normal limits. Appendix appears
normal. No evidence of bowel wall thickening, distention, or
inflammatory changes.

Vascular/Lymphatic: No significant vascular findings are present. No
enlarged abdominal or pelvic lymph nodes.

Reproductive: Multiple brachytherapy seeds in the prostate.

Other: Fat containing small left inguinal hernia. There is also fat
containing umbilical hernia. No evidence of obstruction. No ascites

Musculoskeletal: No acute or significant osseous findings.
IMPRESSION: 1. No evidence of nephrolithiasis or hydronephrosis. No ureteral
calculus. No evidence of recently passed calculus.

2. Bowel loops are normal in caliber. Normal appendix. No evidence
of colitis or diverticulitis.

3.  Multiple brachytherapy seeds in the prostate.

4.  No CT evidence of acute abdominal/pelvic process.
